# Patient Record
Sex: Female | Born: 1990 | Race: Black or African American | Hispanic: No | Marital: Single | Smoking: Current some day smoker
Health system: Southern US, Community
[De-identification: ages and names within clinical notes are randomized; demographics above are authoritative.]

## PROBLEM LIST (undated history)

## (undated) DIAGNOSIS — G43909 Migraine, unspecified, not intractable, without status migrainosus: Secondary | ICD-10-CM

## (undated) DIAGNOSIS — L309 Dermatitis, unspecified: Secondary | ICD-10-CM

## (undated) DIAGNOSIS — J45909 Unspecified asthma, uncomplicated: Secondary | ICD-10-CM

---

## 2011-01-24 ENCOUNTER — Inpatient Hospital Stay (INDEPENDENT_AMBULATORY_CARE_PROVIDER_SITE_OTHER)
Admission: RE | Admit: 2011-01-24 | Discharge: 2011-01-24 | Disposition: A | Discharge status: Home / Self Care | Payer: Medicaid Other | Source: Ambulatory Visit | Attending: Family Medicine | Admitting: Family Medicine

## 2011-01-24 DIAGNOSIS — M549 Dorsalgia, unspecified: Secondary | ICD-10-CM

## 2011-01-24 DIAGNOSIS — R197 Diarrhea, unspecified: Secondary | ICD-10-CM

## 2011-01-24 LAB — POCT I-STAT, CHEM 8
BUN: 6 mg/dL (ref 6–23)
Calcium, Ion: 1.26 mmol/L (ref 1.12–1.32)
Glucose, Bld: 67 mg/dL — ABNORMAL LOW (ref 70–99)
TCO2: 26 mmol/L (ref 0–100)

## 2011-01-24 LAB — POCT URINALYSIS DIP (DEVICE)
Leukocytes, UA: NEGATIVE
Protein, ur: 30 mg/dL — AB
Specific Gravity, Urine: 1.01 (ref 1.005–1.030)
Urobilinogen, UA: 0.2 mg/dL (ref 0.0–1.0)

## 2011-01-24 LAB — POCT PREGNANCY, URINE: Preg Test, Ur: NEGATIVE

## 2011-12-01 ENCOUNTER — Encounter (HOSPITAL_COMMUNITY): Payer: Self-pay | Admitting: *Deleted

## 2011-12-01 ENCOUNTER — Emergency Department (INDEPENDENT_AMBULATORY_CARE_PROVIDER_SITE_OTHER)
Admission: EM | Admit: 2011-12-01 | Discharge: 2011-12-01 | Disposition: A | Discharge status: Home / Self Care | Payer: Medicaid Other | Source: Home / Self Care

## 2011-12-01 DIAGNOSIS — M549 Dorsalgia, unspecified: Secondary | ICD-10-CM

## 2011-12-01 DIAGNOSIS — R300 Dysuria: Secondary | ICD-10-CM

## 2011-12-01 DIAGNOSIS — Z2089 Contact with and (suspected) exposure to other communicable diseases: Secondary | ICD-10-CM

## 2011-12-01 DIAGNOSIS — Z202 Contact with and (suspected) exposure to infections with a predominantly sexual mode of transmission: Secondary | ICD-10-CM

## 2011-12-01 DIAGNOSIS — N898 Other specified noninflammatory disorders of vagina: Secondary | ICD-10-CM

## 2011-12-01 LAB — POCT URINALYSIS DIP (DEVICE)
Glucose, UA: NEGATIVE mg/dL
Hgb urine dipstick: NEGATIVE
Nitrite: NEGATIVE
Protein, ur: NEGATIVE mg/dL
Urobilinogen, UA: 0.2 mg/dL (ref 0.0–1.0)

## 2011-12-01 LAB — WET PREP, GENITAL: Yeast Wet Prep HPF POC: NONE SEEN

## 2011-12-01 MED ORDER — CEFTRIAXONE SODIUM 250 MG IJ SOLR
INTRAMUSCULAR | Status: AC
  Filled 2011-12-01: qty 250

## 2011-12-01 MED ORDER — AZITHROMYCIN 250 MG PO TABS
ORAL_TABLET | ORAL | Status: AC
  Filled 2011-12-01: qty 4

## 2011-12-01 MED ORDER — IBUPROFEN 600 MG PO TABS: 600.0000 mg | ORAL_TABLET | Freq: Four times a day (QID) | ORAL | Status: AC | PRN

## 2011-12-01 MED ORDER — LIDOCAINE HCL (PF) 1 % IJ SOLN
INTRAMUSCULAR | Status: AC
  Filled 2011-12-01: qty 5

## 2011-12-01 MED ORDER — AZITHROMYCIN 250 MG PO TABS
1000.0000 mg | ORAL_TABLET | Freq: Every day | ORAL | Status: DC
  Administered 2011-12-01 (×2): 1000 mg via ORAL

## 2011-12-01 MED ORDER — METHYLPREDNISOLONE 4 MG PO KIT: PACK | ORAL | Status: AC

## 2011-12-01 MED ORDER — CEFTRIAXONE SODIUM 250 MG IJ SOLR
250.0000 mg | Freq: Once | INTRAMUSCULAR | Status: AC
  Administered 2011-12-01: 250 mg via INTRAMUSCULAR

## 2011-12-01 NOTE — ED Provider Notes (Signed)
History     CSN: [id number]  Arrival date & time 12/01/11  1758   None     Chief Complaint  Patient presents with  . Urinary Tract Infection    (Consider location/radiation/quality/duration/timing/severity/associated sxs/prior treatment) The history is provided by the patient.  21 y.o. female complains of dysuria and malodorous white vaginal discharge for 2 weeks.  Denies abnormal vaginal bleeding, significant pelvic pain or fever. Sexually active, bisexual, uses toys witn hx of new partner, states partner was tested positive for chlamydia.  Last unprotected intercourse with toy 3 weeks ago.  Denies history of known exposure to STD.  Patient's last menstrual period was 09/26/2011.  No history of STD's.  Additionally complains of chronic back pain and stiffness, pain is worse with bending and movement.  States she has taken ibuprofen for pain with minimal relief. Denies radiation, no numbness or tingling in extremities.  No known injury.  Reports hx of "cracking" her back.  History reviewed. No pertinent past medical history.  History reviewed. No pertinent past surgical history.  History reviewed. No pertinent family history.  History  Substance Use Topics  . Smoking status: Current Everyday Smoker  . Smokeless tobacco: Not on file  . Alcohol Use: Yes     occasionally    OB History    Grav Para Term Preterm Abortions TAB SAB Ect Mult Living                  Review of Systems  Constitutional: Negative.   Respiratory: Negative.   Cardiovascular: Negative.   Gastrointestinal: Negative for nausea, vomiting, abdominal pain and diarrhea.  Genitourinary: Positive for dysuria and vaginal discharge. Negative for urgency, hematuria, flank pain, decreased urine volume, vaginal bleeding, difficulty urinating and pelvic pain.  Musculoskeletal: Positive for back pain. Negative for myalgias, joint swelling, arthralgias and gait problem.  Skin: Negative.   Neurological: Negative.      Allergies  Review of patient's allergies indicates no known allergies.  Home Medications   Current Outpatient Rx  Name Route Sig Dispense Refill  . IBUPROFEN 600 MG PO TABS Oral Take 1 tablet (600 mg total) by mouth every 6 (six) hours as needed for pain. 30 tablet 0  . METHYLPREDNISOLONE 4 MG PO KIT  follow package directions 21 tablet 0    BP 112/74  Pulse 70  Temp 97 F (36.1 C) (Oral)  Resp 16  SpO2 100%  LMP 09/26/2011  Physical Exam  Nursing note and vitals reviewed. Constitutional: She is oriented to person, place, and time. Vital signs are normal. She appears well-developed and well-nourished. She is active and cooperative.  HENT:  Head: Normocephalic.  Eyes: Conjunctivae are normal. Pupils are equal, round, and reactive to light. No scleral icterus.  Neck: Trachea normal. Neck supple.  Cardiovascular: Normal rate and regular rhythm.   Pulmonary/Chest: Effort normal and breath sounds normal.  Abdominal: Soft. Bowel sounds are normal. There is no tenderness.  Genitourinary: Uterus normal. No labial fusion. There is no rash, tenderness, lesion or injury on the right labia. There is no rash, tenderness, lesion or injury on the left labia. Cervix exhibits no motion tenderness, no discharge and no friability. Right adnexum displays no mass, no tenderness and no fullness. Left adnexum displays no mass, no tenderness and no fullness. No erythema, tenderness or bleeding around the vagina. No foreign body around the vagina. No signs of injury around the vagina. Vaginal discharge found.  Musculoskeletal:       Lumbar back: She exhibits  tenderness and pain. She exhibits normal range of motion, no bony tenderness, no swelling, no edema and no deformity.       Bilateral lumbar paraspinal tenderness, negative straight leg raises  Lymphadenopathy:       Right: No inguinal adenopathy present.       Left: Inguinal adenopathy present.  Neurological: She is alert and oriented to  person, place, and time. She has normal strength and normal reflexes. No cranial nerve deficit or sensory deficit. Coordination and gait normal. GCS eye subscore is 4. GCS verbal subscore is 5. GCS motor subscore is 6.  Skin: Skin is warm and dry. No rash noted.  Psychiatric: She has a normal mood and affect. Her speech is normal and behavior is normal. Judgment and thought content normal. Cognition and memory are normal.    ED Course  Procedures (including critical care time)  Labs Reviewed  WET PREP, GENITAL - Abnormal; Notable for the following:    WBC, Wet Prep HPF POC FEW (*)     All other components within normal limits  POCT URINALYSIS DIP (DEVICE)  POCT PREGNANCY, URINE  GC/CHLAMYDIA PROBE AMP, GENITAL   No results found.   1. Exposure to STD   2. Dysuria   3. Vaginal Discharge   4. Back pain       MDM  Rocephin 250mg  IM and Azithromycin 1000mg  PO administered in the office.  Cleanse sex toys between use.  Your partner(s) will need to be evaluated and tested for STD's.  Condoms for STD prevention.  Abstain from intercourse for 7 days.  Take medrol dose pak as prescribed followed by ibuprofen for intermittent back pain.  Back exercises, you may benefit from physical therapy if this pain persist.        Johnsie Kindred, NP 12/01/11 2051

## 2011-12-01 NOTE — ED Notes (Signed)
1 week of urinary burning without fever.  She also reports low back pain the past 2 years and was seen by her PCP last year and  was told to get a workup for possible Lupus.  She has not done this yet.

## 2011-12-02 LAB — GC/CHLAMYDIA PROBE AMP, GENITAL
Chlamydia, DNA Probe: NEGATIVE
GC Probe Amp, Genital: NEGATIVE

## 2011-12-02 NOTE — ED Provider Notes (Signed)
Medical screening examination/treatment/procedure(s) were performed by non-physician practitioner and as supervising physician I was immediately available for consultation/collaboration.  Dashel Goines   Deangela Randleman, MD 12/02/11 1106 

## 2012-03-01 ENCOUNTER — Emergency Department (INDEPENDENT_AMBULATORY_CARE_PROVIDER_SITE_OTHER)
Admission: EM | Admit: 2012-03-01 | Discharge: 2012-03-01 | Disposition: A | Discharge status: Home / Self Care | Payer: Medicaid Other | Source: Home / Self Care | Attending: Family Medicine | Admitting: Family Medicine

## 2012-03-01 ENCOUNTER — Encounter (HOSPITAL_COMMUNITY): Payer: Self-pay | Admitting: Emergency Medicine

## 2012-03-01 DIAGNOSIS — B356 Tinea cruris: Secondary | ICD-10-CM

## 2012-03-01 HISTORY — DX: Dermatitis, unspecified: L30.9

## 2012-03-01 MED ORDER — CLOTRIMAZOLE-BETAMETHASONE 1-0.05 % EX CREA: TOPICAL_CREAM | CUTANEOUS | Status: DC

## 2012-03-01 NOTE — ED Provider Notes (Signed)
Medical screening examination/treatment/procedure(s) were performed by resident physician or non-physician practitioner and as supervising physician I was immediately available for consultation/collaboration.   Mariapaula Krist DOUGLAS MD.    Shaunae Sieloff D Adra Shepler, MD 03/01/12 2135 

## 2012-03-01 NOTE — ED Provider Notes (Signed)
History     CSN: [id number]  Arrival date & time 03/01/12  1152   None     Chief Complaint  Patient presents with  . Tinea    (Consider location/radiation/quality/duration/timing/severity/associated sxs/prior treatment) Patient is a 21 y.o. female presenting with rash. The history is provided by the patient.  Rash  This is a new problem. The current episode started more than 1 week ago. The problem has been gradually worsening (spreading). Associated with: family member with same infection. There has been no fever. The rash is present on the neck, right arm and abdomen. The patient is experiencing no pain. Associated symptoms include itching. Pertinent negatives include no blisters, no pain and no weeping. She has tried anti-itch cream for the symptoms. The treatment provided mild relief.    Past Medical History  Diagnosis Date  . Eczema     History reviewed. No pertinent past surgical history.  No family history on file.  History  Substance Use Topics  . Smoking status: Current Every Day Smoker  . Smokeless tobacco: Not on file  . Alcohol Use: Yes     Comment: occasionally    OB History    Grav Para Term Preterm Abortions TAB SAB Ect Mult Living                  Review of Systems  Skin: Positive for itching and rash.  All other systems reviewed and are negative.    Allergies  Review of patient's allergies indicates no known allergies.  Home Medications   Current Outpatient Rx  Name  Route  Sig  Dispense  Refill  . CLOTRIMAZOLE-BETAMETHASONE 1-0.05 % EX CREA      Apply to affected area 2 times daily   15 g   1     BP 105/72  Pulse 80  Temp 97.9 F (36.6 C) (Oral)  Resp 20  SpO2 100%  LMP 01/18/2012  Physical Exam  Nursing note and vitals reviewed. Constitutional: She is oriented to person, place, and time. Vital signs are normal. She appears well-developed and well-nourished. She is active and cooperative.  HENT:  Head: Normocephalic.    Eyes: Conjunctivae normal are normal. Pupils are equal, round, and reactive to light. No scleral icterus.  Neck: Trachea normal. Neck supple.  Cardiovascular: Normal rate, regular rhythm, normal heart sounds and intact distal pulses.   Pulmonary/Chest: Effort normal and breath sounds normal.  Neurological: She is alert and oriented to person, place, and time. No cranial nerve deficit or sensory deficit.  Skin: Skin is warm and dry. Rash noted.          Multiple annular, hyperpigmented lesions  Psychiatric: She has a normal mood and affect. Her speech is normal and behavior is normal. Judgment and thought content normal. Cognition and memory are normal.    ED Course  Procedures (including critical care time)  Labs Reviewed - No data to display No results found.   1. Tinea cruris       MDM  Cool showers; avoid heat, sunlight and anything that makes condition worse.  Begin medication as prescribed.  RTC if symptoms do not improve or begin to have problems swallowing, breathing or significant change in condition.       Johnsie Kindred, NP 03/01/12 1256

## 2012-03-01 NOTE — ED Notes (Signed)
Pt c/o poss ringworms... Came in contact w/20 year old cousin who has ringworm... Rash located on: right side of neck, shoulder, hip, abd... Denies: fevers, vomiting, nauseas, diarrhea... Pt is alert w/no signs of distress.

## 2012-03-19 ENCOUNTER — Encounter (HOSPITAL_COMMUNITY): Payer: Self-pay | Admitting: Emergency Medicine

## 2012-03-19 ENCOUNTER — Emergency Department (INDEPENDENT_AMBULATORY_CARE_PROVIDER_SITE_OTHER)
Admission: EM | Admit: 2012-03-19 | Discharge: 2012-03-19 | Disposition: A | Discharge status: Home / Self Care | Payer: Medicaid Other | Source: Home / Self Care | Attending: Emergency Medicine | Admitting: Emergency Medicine

## 2012-03-19 DIAGNOSIS — H60399 Other infective otitis externa, unspecified ear: Secondary | ICD-10-CM

## 2012-03-19 DIAGNOSIS — H6090 Unspecified otitis externa, unspecified ear: Secondary | ICD-10-CM

## 2012-03-19 MED ORDER — NEOMYCIN-POLYMYXIN-HC 3.5-10000-1 OT SUSP: 3.0000 [drp] | Freq: Four times a day (QID) | OTIC | Status: DC

## 2012-03-19 NOTE — ED Notes (Signed)
Pt c/o bilateral ear pain x1 week... Sx include: vomiting, nauseas... Denies: drainage, fevers, diarrhea, cold sx... She is alert w/no sign of acute distress.

## 2012-03-19 NOTE — Discharge Instructions (Signed)
Otitis Externa Otitis externa is a bacterial or fungal infection of the outer ear canal. This is the area from the eardrum to the outside of the ear. Otitis externa is sometimes called "swimmer's ear." CAUSES  Possible causes of infection include:  Swimming in dirty water.  Moisture remaining in the ear after swimming or bathing.  Mild injury (trauma) to the ear.  Objects stuck in the ear (foreign body).  Cuts or scrapes (abrasions) on the outside of the ear. SYMPTOMS  The first symptom of infection is often itching in the ear canal. Later signs and symptoms may include swelling and redness of the ear canal, ear pain, and yellowish-white fluid (pus) coming from the ear. The ear pain may be worse when pulling on the earlobe. DIAGNOSIS  Your caregiver will perform a physical exam. A sample of fluid may be taken from the ear and examined for bacteria or fungi. TREATMENT  Antibiotic ear drops are often given for 10 to 14 days. Treatment may also include pain medicine or corticosteroids to reduce itching and swelling. PREVENTION   Keep your ear dry. Use the corner of a towel to absorb water out of the ear canal after swimming or bathing.  Avoid scratching or putting objects inside your ear. This can damage the ear canal or remove the protective wax that lines the canal. This makes it easier for bacteria and fungi to grow.  Avoid swimming in lakes, polluted water, or poorly chlorinated pools.  You may use ear drops made of rubbing alcohol and vinegar after swimming. Combine equal parts of white vinegar and alcohol in a bottle. Put 3 or 4 drops into each ear after swimming. HOME CARE INSTRUCTIONS   Apply antibiotic ear drops to the ear canal as prescribed by your caregiver.  Only take over-the-counter or prescription medicines for pain, discomfort, or fever as directed by your caregiver.  If you have diabetes, follow any additional treatment instructions from your caregiver.  Keep all  follow-up appointments as directed by your caregiver. SEEK MEDICAL CARE IF:   You have a fever.  Your ear is still red, swollen, painful, or draining pus after 3 days.  Your redness, swelling, or pain gets worse.  You have a severe headache.  You have redness, swelling, pain, or tenderness in the area behind your ear. MAKE SURE YOU:   Understand these instructions.  Will watch your condition.  Will get help right away if you are not doing well or get worse. Document Released: 03/30/2005 Document Revised: 06/22/2011 Document Reviewed: 04/16/2011 [employer] Patient Information 2013 [employer].  

## 2012-03-19 NOTE — ED Provider Notes (Signed)
Chief Complaint  Patient presents with  . Otalgia    History of Present Illness:   The patient is a 21 year old female who has had a one-week history of bilateral ear pain and congestion. She denies any drainage. Her hearing is normal. She's had no fever, chills, headache, nasal congestion, rhinorrhea, sore throat, swollen glands, or cough. No prior history of ear infections or ear problems. No history of swimming or getting water in the ears.  Review of Systems:  Other than noted above, the patient denies any of the following symptoms: Systemic:  No fevers, chills, sweats, weight loss or gain, fatigue, or tiredness. Eye:  No redness, pain, discharge, itching, blurred vision, or diplopia. ENT:  No headache, nasal congestion, sneezing, itching, epistaxis, ear pain, congestion, decreased hearing, ringing in ears, vertigo, or tinnitus.  No oral lesions, sore throat, pain on swallowing, or hoarseness. Neck:  No mass, tenderness or adenopathy. Lungs:  No coughing, wheezing, or shortness of breath. Skin:  No rash or itching.  PMFSH:  Past medical history, family history, social history, meds, and allergies were reviewed.  Physical Exam:   Vital signs:  BP 101/66  Pulse 71  Temp 97.9 F (36.6 C) (Oral)  Resp 17  SpO2 100%  LMP 01/18/2012 General:  Alert and oriented.  In no distress.  Skin warm and dry. Eye:  PERRL, full EOMs, lids and conjunctiva normal.   ENT:  Her canals are very small. There is some cerumen in the ear canals and this was cleaned out. The ear canals were slightly erythematous but no swelling or exudate or debris. The TMs were well seen and were normal.  Nasal mucosa not congested and without drainage.  Mucous membranes moist, no oral lesions, normal dentition, pharynx clear.  No cranial or facial pain to palplation. Neck:  Supple, full ROM.  No adenopathy, tenderness or mass.  Thyroid normal. Lungs:  Breath sounds clear and equal bilaterally.  No wheezes, rales or  rhonchi. Heart:  Rhythm regular, without extrasystoles.  No gallops or murmers. Skin:  Clear, warm and dry.   Assessment:  The encounter diagnosis was Otitis externa.  Plan:   1.  The following meds were prescribed:   New Prescriptions   NEOMYCIN-POLYMYXIN-HYDROCORTISONE (CORTISPORIN) 3.5-10000-1 OTIC SUSPENSION    Place 3 drops into both ears 4 (four) times daily.   2.  The patient was instructed in symptomatic care and handouts were given. 3.  The patient was told to return if becoming worse in any way, if no better in 3 or 4 days, and given some red flag symptoms that would indicate earlier return.  Follow up:  The patient was told to follow up here if no better in one week.       Reuben Likes, MD 03/19/12 2052

## 2012-07-26 ENCOUNTER — Emergency Department (INDEPENDENT_AMBULATORY_CARE_PROVIDER_SITE_OTHER)
Admission: EM | Admit: 2012-07-26 | Discharge: 2012-07-26 | Disposition: A | Discharge status: Home / Self Care | Payer: Medicaid Other | Source: Home / Self Care | Attending: Emergency Medicine | Admitting: Emergency Medicine

## 2012-07-26 ENCOUNTER — Encounter (HOSPITAL_COMMUNITY): Payer: Self-pay

## 2012-07-26 DIAGNOSIS — J309 Allergic rhinitis, unspecified: Secondary | ICD-10-CM

## 2012-07-26 DIAGNOSIS — L259 Unspecified contact dermatitis, unspecified cause: Secondary | ICD-10-CM

## 2012-07-26 MED ORDER — OLOPATADINE HCL 0.2 % OP SOLN: OPHTHALMIC | Status: DC

## 2012-07-26 MED ORDER — FLUTICASONE PROPIONATE 50 MCG/ACT NA SUSP: 2.0000 | Freq: Every day | NASAL | Status: DC

## 2012-07-26 MED ORDER — TRIAMCINOLONE ACETONIDE 0.1 % EX CREA: TOPICAL_CREAM | Freq: Three times a day (TID) | CUTANEOUS | Status: DC

## 2012-07-26 NOTE — ED Provider Notes (Signed)
Chief Complaint:   Chief Complaint  Patient presents with  . Allergic Reaction    History of Present Illness:   Sharon Adkins is a 22 year old female who had a tattoo on her right forearm about 2 weeks ago. This is been raised, red, and itching. She has had tattoos performed or reactive them in the past. She denies any wheezing or difficulty breathing and no swelling of lips, tongue, or throat. She also has allergy symptoms with nasal congestion with clear rhinorrhea, cough, sore throat, and itchy, watery eyes. She is taking Zyrtec right now for these symptoms.  Review of Systems:  Other than noted above, the patient denies any of the following symptoms: Systemic:  No fever, chills, sweats, weight loss, or fatigue. ENT:  No nasal congestion, rhinorrhea, sore throat, swelling of lips, tongue or throat. Resp:  No cough, wheezing, or shortness of breath. Skin:  No rash, itching, nodules, or suspicious lesions.  PMFSH:  Past medical history, family history, social history, meds, and allergies were reviewed.   Physical Exam:   Vital signs:  BP 106/74  Pulse 70  Temp(Src) 97.6 F (36.4 C) (Oral)  Resp 16  SpO2 100% Gen:  Alert, oriented, in no distress. ENT:  Pharynx clear, no intraoral lesions, moist mucous membranes. Lungs:  Clear to auscultation. Skin:  She has a tattoo on her right arm which is somewhat raised, nontender to palpation, and there is no surrounding erythema or swelling. Skin is otherwise clear.  Assessment:  The primary encounter diagnosis was Contact dermatitis. A diagnosis of Allergic rhinitis was also pertinent to this visit.  She has a reaction to the tattoo.  Plan:   1.  The following meds were prescribed:   Discharge Medication List as of 07/26/2012 12:25 PM    START taking these medications   Details  fluticasone (FLONASE) 50 MCG/ACT nasal spray Place 2 sprays into the nose daily., Starting 07/26/2012, Until Discontinued, Normal    Olopatadine HCl (PATADAY)  0.2 % SOLN 1 drop in both eyes once daily for allergies, Normal    triamcinolone cream (KENALOG) 0.1 % Apply topically 3 (three) times daily., Starting 07/26/2012, Until Discontinued, Normal       2.  The patient was instructed in symptomatic care and handouts were given. 3.  The patient was told to return if becoming worse in any way, if no better in 3 or 4 days, and given some red flag symptoms such as difficulty breathing or worsening pain that would indicate earlier return.     Reuben Likes, MD 07/26/12 [phone]

## 2012-07-26 NOTE — ED Notes (Addendum)
37 week old tattoo on right forearm, using A&D ointment as directed, but has "itchy bumps" on it. No other area has the bumps  Also concern for her seasonal allergies

## 2012-09-24 ENCOUNTER — Encounter (HOSPITAL_COMMUNITY): Payer: Self-pay | Admitting: Emergency Medicine

## 2012-09-24 ENCOUNTER — Emergency Department (HOSPITAL_COMMUNITY)
Admission: EM | Admit: 2012-09-24 | Discharge: 2012-09-24 | Disposition: A | Discharge status: Home / Self Care | Payer: Medicaid Other | Attending: Emergency Medicine | Admitting: Emergency Medicine

## 2012-09-24 ENCOUNTER — Emergency Department (HOSPITAL_COMMUNITY): Payer: Medicaid Other

## 2012-09-24 DIAGNOSIS — F17200 Nicotine dependence, unspecified, uncomplicated: Secondary | ICD-10-CM | POA: Insufficient documentation

## 2012-09-24 DIAGNOSIS — R209 Unspecified disturbances of skin sensation: Secondary | ICD-10-CM | POA: Insufficient documentation

## 2012-09-24 DIAGNOSIS — IMO0002 Reserved for concepts with insufficient information to code with codable children: Secondary | ICD-10-CM | POA: Insufficient documentation

## 2012-09-24 DIAGNOSIS — Z872 Personal history of diseases of the skin and subcutaneous tissue: Secondary | ICD-10-CM | POA: Insufficient documentation

## 2012-09-24 DIAGNOSIS — R51 Headache: Secondary | ICD-10-CM | POA: Insufficient documentation

## 2012-09-24 HISTORY — DX: Migraine, unspecified, not intractable, without status migrainosus: G43.909

## 2012-09-24 NOTE — ED Notes (Signed)
Pt discharged to home with family. NAD.  

## 2012-09-24 NOTE — ED Provider Notes (Signed)
History     CSN: [id number]  Arrival date & time 09/24/12  4098   First MD Initiated Contact with Patient 09/24/12 [phone]      Chief Complaint  Patient presents with  . Headache    (Consider location/radiation/quality/duration/timing/severity/associated sxs/prior treatment) HPI Comments: Patient complains of a two-week history of intermittent left-sided pain in her head that lasts for 2-3 minutes at a time and resolves. As associated with twitching in her left eye and sometimes some numbness in her left arm. The pain resolved on its own. Lungs the last 3 minutes. After 4-5 times a day for the past 2 weeks. She reports history is of "migraines" in the past but has not seen a neurologist. She's taken some Tylenol without effect. She denies any fevers, chills, nausea, vomiting, phonophobia or photophobia. Good by mouth intake and urine output. No rashes or tick bites. No dizziness or lightheadedness. She is not having any symptoms at this time.  The history is provided by the patient.    Past Medical History  Diagnosis Date  . Eczema   . Migraine     History reviewed. No pertinent past surgical history.  No family history on file.  History  Substance Use Topics  . Smoking status: Current Every Day Smoker    Types: Cigars  . Smokeless tobacco: Not on file  . Alcohol Use: Yes     Comment: occasionally    OB History   Grav Para Term Preterm Abortions TAB SAB Ect Mult Living                  Review of Systems  Constitutional: Negative for fever, activity change and appetite change.  HENT: Negative for congestion and rhinorrhea.   Eyes: Negative for photophobia and visual disturbance.  Respiratory: Negative for cough, chest tightness and shortness of breath.   Cardiovascular: Negative for chest pain.  Gastrointestinal: Negative for nausea, vomiting and abdominal pain.  Genitourinary: Negative for dysuria, vaginal bleeding and vaginal discharge.  Musculoskeletal: Negative  for back pain.  Skin: Negative for rash.  Neurological: Positive for numbness and headaches. Negative for weakness and light-headedness.  A complete 10 system review of systems was obtained and all systems are negative except as noted in the HPI and PMH.    Allergies  Review of patient's allergies indicates no known allergies.  Home Medications   Current Outpatient Rx  Name  Route  Sig  Dispense  Refill  . fluticasone (FLONASE) 50 MCG/ACT nasal spray   Nasal   Place 2 sprays into the nose daily.   16 g   12     BP 102/72  Pulse 72  Temp(Src) 97.6 F (36.4 C) (Oral)  Resp 18  Ht 5\' 5"  (1.651 m)  Wt 114 lb (51.71 kg)  BMI 18.97 kg/m2  SpO2 100%  LMP 08/27/2012  Physical Exam  Constitutional: She is oriented to person, place, and time. She appears well-developed and well-nourished. No distress.  HENT:  Head: Normocephalic and atraumatic.  Mouth/Throat: Oropharynx is clear and moist. No oropharyngeal exudate.  No temporal artery tenderness L posterior scalp TTP  Eyes: Conjunctivae and EOM are normal. Pupils are equal, round, and reactive to light.  Neck: Normal range of motion. Neck supple.  Cardiovascular: Normal rate, regular rhythm and normal heart sounds.   No murmur heard. Pulmonary/Chest: Effort normal and breath sounds normal. No respiratory distress. She has no wheezes.  Abdominal: Soft. There is no tenderness. There is no rebound and no guarding.  Musculoskeletal: Normal range of motion. She exhibits no edema and no tenderness.  Neurological: She is alert and oriented to person, place, and time. No cranial nerve deficit. She exhibits normal muscle tone. Coordination normal.  CN 2-12 intact, no ataxia on finger to nose, no nystagmus, 5/5 strength throughout, no pronator drift, Romberg negative, normal gait.   Skin: Skin is warm.    ED Course  Procedures (including critical care time)  Labs Reviewed - No data to display Ct Head Wo Contrast  09/24/2012    *RADIOLOGY REPORT*  Clinical Data: Intermittent pain on the left side of the head.  CT HEAD WITHOUT CONTRAST  Technique:  Contiguous axial images were obtained from the base of the skull through the vertex without contrast.  Comparison: None.  Findings: No evidence for acute hemorrhage, mass lesion, midline shift, hydrocephalus or large infarct.  Visualized sinuses are clear.  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  No acute bony abnormality.  IMPRESSION: Negative head CT.   Original Report Authenticated By: Richarda Overlie, M.D.     No diagnosis found.    MDM  Intermittent left-sided headaches for the past 2 weeks lasting a few minutes at a time and resolving. Associated with eye twitching and numbness. No symptoms currently.  Neurological exam normal. History exam not consistent with subarachnoid hemorrhage or meningitis. Favor probable migraine. Patient has not had CT imaging we'll obtain to rule out mass lesion.  CT head normal. Neuro exam normal.  Will refer to neurology for followup.     Glynn Octave, MD 09/24/12 [phone]

## 2012-09-24 NOTE — ED Notes (Signed)
Visual acuity screening completed. Left eye 20/25, right eye 20/20, both eyes 20/20.

## 2012-09-24 NOTE — ED Notes (Addendum)
Patient states she has been having intermittent sharp pains on the left side of her head.  Patient states that when she has the pain "I also have a twitch in my eye".   Patient states that sometimes her L arm gets numb.   Patient states she had this same trouble a year ago and was told she had migraines.  Patient states its a brief pain that last about 2 minutes or so each time.

## 2013-02-16 ENCOUNTER — Encounter (HOSPITAL_COMMUNITY): Payer: Self-pay | Admitting: Emergency Medicine

## 2013-02-16 ENCOUNTER — Emergency Department (HOSPITAL_COMMUNITY)
Admission: EM | Admit: 2013-02-16 | Discharge: 2013-02-16 | Disposition: A | Discharge status: Home / Self Care | Payer: Medicaid Other | Attending: Emergency Medicine | Admitting: Emergency Medicine

## 2013-02-16 ENCOUNTER — Emergency Department (HOSPITAL_COMMUNITY): Payer: Medicaid Other

## 2013-02-16 DIAGNOSIS — R51 Headache: Secondary | ICD-10-CM | POA: Insufficient documentation

## 2013-02-16 DIAGNOSIS — R0789 Other chest pain: Secondary | ICD-10-CM | POA: Insufficient documentation

## 2013-02-16 DIAGNOSIS — J069 Acute upper respiratory infection, unspecified: Secondary | ICD-10-CM | POA: Insufficient documentation

## 2013-02-16 DIAGNOSIS — F17200 Nicotine dependence, unspecified, uncomplicated: Secondary | ICD-10-CM | POA: Insufficient documentation

## 2013-02-16 DIAGNOSIS — Z872 Personal history of diseases of the skin and subcutaneous tissue: Secondary | ICD-10-CM | POA: Insufficient documentation

## 2013-02-16 DIAGNOSIS — Z8679 Personal history of other diseases of the circulatory system: Secondary | ICD-10-CM | POA: Insufficient documentation

## 2013-02-16 DIAGNOSIS — J40 Bronchitis, not specified as acute or chronic: Secondary | ICD-10-CM | POA: Insufficient documentation

## 2013-02-16 MED ORDER — FLUTICASONE PROPIONATE 50 MCG/ACT NA SUSP: 2.0000 | Freq: Every day | NASAL | Status: DC

## 2013-02-16 NOTE — ED Notes (Signed)
Pt to xray

## 2013-02-16 NOTE — ED Notes (Signed)
Pt c/o URI x 3 weeks with cough

## 2013-02-16 NOTE — ED Provider Notes (Signed)
CSN: [id number]     Arrival date & time 02/16/13  1836 History  This chart was scribed for non-physician practitioner, Dierdre Forth, PA-C working with No att. providers found by Clydene Laming, ED Scribe. This patient was seen in room TR04C/TR04C and the patient's care was started at 7:50 PM.   Chief Complaint  Patient presents with  . URI    The history is provided by the patient and medical records. No language interpreter was used.   HPI Comments: Eduarda Scrivens is a 22 y.o. female who presents to the Emergency Department complaining of a cold onset three weeks ago. Pt is experiencing a productive cough, chest pain, mild ear pain, and nasal congestion. Pt denies sore throat, nausea, emesis, and rash. Pt does not have hx of medical problems or asthma. Pt has been taking Nyquil with mild relief which also helps her sleep at night. Her roommate also presents to the ED today with similar symptoms.  Patient denies fever, chills, neck pain, neck stiffness, abdominal pain.  Past Medical History  Diagnosis Date  . Eczema   . Migraine    History reviewed. No pertinent past surgical history. History reviewed. No pertinent family history. History  Substance Use Topics  . Smoking status: Current Every Day Smoker    Types: Cigars  . Smokeless tobacco: Not on file  . Alcohol Use: Yes     Comment: occasionally   OB History   Grav Para Term Preterm Abortions TAB SAB Ect Mult Living                 Review of Systems  Constitutional: Positive for fatigue. Negative for fever, chills and appetite change.  HENT: Positive for congestion, postnasal drip, rhinorrhea and sinus pressure. Negative for ear discharge, ear pain, mouth sores and sore throat.   Eyes: Negative for visual disturbance.  Respiratory: Positive for cough, chest tightness, shortness of breath and wheezing. Negative for stridor.   Cardiovascular: Negative for chest pain, palpitations and leg swelling.  Gastrointestinal:  Negative for nausea, vomiting, abdominal pain and diarrhea.  Genitourinary: Negative for dysuria, urgency, frequency and hematuria.  Musculoskeletal: Negative for arthralgias, back pain, myalgias and neck stiffness.  Skin: Negative for rash.  Neurological: Positive for headaches. Negative for syncope, light-headedness and numbness.  Hematological: Negative for adenopathy.  Psychiatric/Behavioral: The patient is not nervous/anxious.   All other systems reviewed and are negative.    Allergies  Review of patient's allergies indicates no known allergies.  Home Medications   Current Outpatient Rx  Name  Route  Sig  Dispense  Refill  . fluticasone (FLONASE) 50 MCG/ACT nasal spray   Each Nare   Place 2 sprays into both nostrils daily.   16 g   2    Triage Vitals:BP 104/62  Pulse 67  Temp(Src) 97.5 F (36.4 C) (Oral)  Resp 18  SpO2 100% Physical Exam  Constitutional: She is oriented to person, place, and time. She appears well-developed and well-nourished. No distress.  HENT:  Head: Normocephalic and atraumatic.  Right Ear: Tympanic membrane, external ear and ear canal normal.  Left Ear: Tympanic membrane, external ear and ear canal normal.  Nose: Mucosal edema and rhinorrhea present. No epistaxis. Right sinus exhibits no maxillary sinus tenderness and no frontal sinus tenderness. Left sinus exhibits no maxillary sinus tenderness and no frontal sinus tenderness.  Mouth/Throat: Uvula is midline and mucous membranes are normal. Mucous membranes are not pale and not cyanotic. No oropharyngeal exudate, posterior oropharyngeal edema, posterior oropharyngeal erythema  or tonsillar abscesses.  Eyes: Conjunctivae are normal. Pupils are equal, round, and reactive to light.  Neck: Normal range of motion and full passive range of motion without pain.  No nuchal rigidity  Cardiovascular: Normal rate, regular rhythm, normal heart sounds and intact distal pulses.   No murmur  heard. Pulmonary/Chest: Effort normal and breath sounds normal. No stridor.  Clear and equal breath sounds  Abdominal: Soft. Bowel sounds are normal. She exhibits no distension. There is no tenderness.  Abdomen soft and nontender  Musculoskeletal: Normal range of motion.  Lymphadenopathy:    She has no cervical adenopathy.  Neurological: She is alert and oriented to person, place, and time. She exhibits normal muscle tone. Coordination normal.  Skin: Skin is warm and dry. No rash noted. She is not diaphoretic. No erythema.  Psychiatric: She has a normal mood and affect.    ED Course  Procedures (including critical care time) DIAGNOSTIC STUDIES: Oxygen Saturation is 100% on RA, normal by my interpretation.    COORDINATION OF CARE: 7:56 PM- Discussed treatment plan with pt at bedside. Pt verbalized understanding and agreement with plan.   Labs Review Labs Reviewed - No data to display Imaging Review Dg Chest 2 View  02/16/2013   CLINICAL DATA:  Cold symptoms  EXAM: CHEST  2 VIEW  COMPARISON:  None.  FINDINGS: The heart size and mediastinal contours are within normal limits. Both lungs are clear. The visualized skeletal structures are unremarkable.  IMPRESSION: No active cardiopulmonary disease.   Electronically Signed   By: Alcide Clever M.D.   On: 02/16/2013 19:26    EKG Interpretation   None       MDM   1. Viral URI with cough   2. Bronchitis      Aprille Marda Stalker presents with URI symptoms.  Pt CXR negative for acute infiltrate. I personally reviewed the imaging tests through PACS system.  I reviewed available ER/hospitalization records through the EMR.  Patients symptoms are consistent with URI, likely viral etiology. Discussed that antibiotics are not indicated for viral infections. Pt will be discharged with symptomatic treatment.  Verbalizes understanding and is agreeable with plan. Pt is hemodynamically stable & in NAD prior to dc.  It has been determined that no  acute conditions requiring further emergency intervention are present at this time. The patient/guardian have been advised of the diagnosis and plan. We have discussed signs and symptoms that warrant return to the ED, such as changes or worsening in symptoms.   Vital signs are stable at discharge.   BP 104/62  Pulse 67  Temp(Src) 97.5 F (36.4 C) (Oral)  Resp 18  SpO2 100%  LMP 02/05/2013  Patient/guardian has voiced understanding and agreed to follow-up with the PCP or specialist.    I personally performed the services described in this documentation, which was scribed in my presence. The recorded information has been reviewed and is accurate.    Dahlia Client Mitsue Peery, PA-C 02/16/13 2133

## 2013-02-16 NOTE — ED Notes (Signed)
The pt is c/o having a cold for 3 weeks  With coughing  Chest congestion and not getting better.  White sputum

## 2013-02-17 NOTE — ED Provider Notes (Signed)
Medical screening examination/treatment/procedure(s) were performed by non-physician practitioner and as supervising physician I was immediately available for consultation/collaboration.  EKG Interpretation   None        Shon Baton, MD 02/17/13 1442

## 2013-06-22 ENCOUNTER — Emergency Department (HOSPITAL_COMMUNITY)
Admission: EM | Admit: 2013-06-22 | Discharge: 2013-06-22 | Disposition: A | Discharge status: Home / Self Care | Payer: Medicaid Other | Attending: Emergency Medicine | Admitting: Emergency Medicine

## 2013-06-22 ENCOUNTER — Encounter (HOSPITAL_COMMUNITY): Payer: Self-pay | Admitting: Emergency Medicine

## 2013-06-22 ENCOUNTER — Emergency Department (HOSPITAL_COMMUNITY): Payer: Medicaid Other

## 2013-06-22 DIAGNOSIS — Y9389 Activity, other specified: Secondary | ICD-10-CM | POA: Insufficient documentation

## 2013-06-22 DIAGNOSIS — Y9289 Other specified places as the place of occurrence of the external cause: Secondary | ICD-10-CM | POA: Insufficient documentation

## 2013-06-22 DIAGNOSIS — Z872 Personal history of diseases of the skin and subcutaneous tissue: Secondary | ICD-10-CM | POA: Insufficient documentation

## 2013-06-22 DIAGNOSIS — S62607A Fracture of unspecified phalanx of left little finger, initial encounter for closed fracture: Secondary | ICD-10-CM

## 2013-06-22 DIAGNOSIS — F17200 Nicotine dependence, unspecified, uncomplicated: Secondary | ICD-10-CM | POA: Insufficient documentation

## 2013-06-22 DIAGNOSIS — Z8679 Personal history of other diseases of the circulatory system: Secondary | ICD-10-CM | POA: Insufficient documentation

## 2013-06-22 DIAGNOSIS — W230XXA Caught, crushed, jammed, or pinched between moving objects, initial encounter: Secondary | ICD-10-CM | POA: Insufficient documentation

## 2013-06-22 DIAGNOSIS — S62639B Displaced fracture of distal phalanx of unspecified finger, initial encounter for open fracture: Secondary | ICD-10-CM | POA: Insufficient documentation

## 2013-06-22 MED ORDER — IBUPROFEN 800 MG PO TABS: 800.0000 mg | ORAL_TABLET | Freq: Three times a day (TID) | ORAL | Status: DC

## 2013-06-22 MED ORDER — IBUPROFEN 400 MG PO TABS
800.0000 mg | ORAL_TABLET | Freq: Once | ORAL | Status: AC
  Administered 2013-06-22: 800 mg via ORAL
  Filled 2013-06-22: qty 2

## 2013-06-22 MED ORDER — ACETAMINOPHEN-CODEINE #3 300-30 MG PO TABS: 1.0000 | ORAL_TABLET | Freq: Four times a day (QID) | ORAL | Status: DC | PRN

## 2013-06-22 NOTE — ED Notes (Signed)
Ortho paged. 

## 2013-06-22 NOTE — Discharge Instructions (Signed)
Please follow up with your primary care physician in 1-2 days. If you do not have one please [facility] listed above. Please follow up with Dr. Amanda PeaGramig, the hand surgeon to schedule a follow up appointment. Please take pain medication and/or muscle relaxants as prescribed and as needed for pain. Please do not drive on narcotic pain medication or on muscle relaxants. Please read all discharge instructions and return precautions.      Finger Fracture Fractures of fingers are breaks in the bones of the fingers. There are many types of fractures. There are different ways of treating these fractures. Your health care provider will discuss the best way to treat your fracture. CAUSES Traumatic injury is the main cause of broken fingers. These include:  Injuries while playing sports.  Workplace injuries.  Falls. RISK FACTORS Activities that can increase your risk of finger fractures include:  Sports.  Workplace activities that involve machinery.  A condition called osteoporosis, which can make your bones less dense and cause them to fracture more easily. SIGNS AND SYMPTOMS The main symptoms of a broken finger are pain and swelling within 15 minutes after the injury. Other symptoms include:  Bruising of your finger.  Stiffness of your finger.  Numbness of your finger.  Exposed bones (compound fracture) if the fracture is severe. DIAGNOSIS  The best way to diagnose a broken bone is with X-ray imaging. Additionally, your health care provider will use this X-ray image to evaluate the position of the broken finger bones.  TREATMENT  Finger fractures can be treated with:   Nonreduction This means the bones are in place. The finger is splinted without changing the positions of the bone pieces. The splint is usually left on for about a week to 10 days. This will depend on your fracture and what your health care provider thinks.  Closed reduction The bones  are put back into position without using surgery. The finger is then splinted.  Open reduction and internal fixation The fracture site is opened. Then the bone pieces are fixed into place with pins or some type of hardware. This is seldom required. It depends on the severity of the fracture. HOME CARE INSTRUCTIONS   Follow your health care provider's instructions regarding activities, exercises, and physical therapy.  Only take over-the-counter or prescription medicines for pain, discomfort, or fever as directed by your health care provider. SEEK MEDICAL CARE IF: You have pain or swelling that limits the motion or use of your fingers. SEEK IMMEDIATE MEDICAL CARE IF:  Your finger becomes numb. MAKE SURE YOU:   Understand these instructions.  Will watch your condition.  Will get help right away if you are not doing well or get worse. Document Released: 07/12/2000 Document Revised: 01/18/2013 Document Reviewed: 11/09/2012 [facility] Patient Information 2014 [place].

## 2013-06-22 NOTE — ED Notes (Signed)
Patient transported to X-ray and return 

## 2013-06-22 NOTE — ED Notes (Signed)
Ortho at bedside.

## 2013-06-22 NOTE — ED Notes (Signed)
Pt states that slammed pinky finger or the left hand in the car door last Wed. Finger turned black but is now pink around the nail bed. Able to move the extremity but very painful. Has not taken any meds for pain.

## 2013-06-22 NOTE — ED Notes (Signed)
PA at bedside.

## 2013-06-22 NOTE — ED Notes (Addendum)
Ice applied to left little finger.

## 2013-06-22 NOTE — ED Provider Notes (Signed)
CSN: [id number]     Arrival date & time 06/22/13  1514 History  This chart was scribed for Francee PiccoloJennifer Diksha Tagliaferro, PA working with Raelyn NumberKristen N Ward, DO by Quintella ReichertMatthew Underwood, ED Scribe. This patient was seen in room TR05C/TR05C and the patient's care was started at 3:34 PM.   Chief Complaint  Patient presents with  . Finger Injury    The history is provided by the patient. No language interpreter was used.    HPI Comments: Sharon Adkins is a 23 y.o. female who presents to the Emergency Department complaining of a left pinky finger injury sustained 8 days ago.  Pt states that she accidentally slammed her finger in a car door.  The finger has since become swollen, bruised and painful.  She reports intermittent throbbing non-radiating pain to that finger.  Pain is worsened by squeezing the hand.  She denies alleviating factors.  She used ice for the first several days but has not attempted any other treatments pta.  Pt is left-handed.  She denies previous injury to that hand.   Past Medical History  Diagnosis Date  . Eczema   . Migraine     History reviewed. No pertinent past surgical history.  History reviewed. No pertinent family history.   History  Substance Use Topics  . Smoking status: Current Some Day Smoker    Types: Cigars  . Smokeless tobacco: Not on file  . Alcohol Use: Yes    OB History   Grav Para Term Preterm Abortions TAB SAB Ect Mult Living                   Review of Systems  Musculoskeletal: Positive for arthralgias (left pinky finger) and joint swelling (left pinky finger).  Neurological: Negative for weakness and numbness.      Allergies  Review of patient's allergies indicates no known allergies.  Home Medications   Current Outpatient Rx  Name  Route  Sig  Dispense  Refill  . acetaminophen-codeine (TYLENOL #3) 300-30 MG per tablet   Oral   Take 1-2 tablets by mouth every 6 (six) hours as needed for moderate pain.   15 tablet   0   .  ibuprofen (ADVIL,MOTRIN) 800 MG tablet   Oral   Take 1 tablet (800 mg total) by mouth 3 (three) times daily.   21 tablet   0     BP 128/77  Pulse 98  Temp(Src) 97.9 F (36.6 C) (Oral)  Resp 16  Ht 5\' 5"  (1.651 m)  Wt 111 lb 9.6 oz (50.621 kg)  BMI 18.57 kg/m2  SpO2 94%  Physical Exam  Nursing note and vitals reviewed. Constitutional: She is oriented to person, place, and time. She appears well-developed and well-nourished. No distress.  HENT:  Head: Normocephalic and atraumatic.  Right Ear: External ear normal.  Left Ear: External ear normal.  Nose: Nose normal.  Mouth/Throat: Oropharynx is clear and moist.  Eyes: Conjunctivae are normal.  Neck: Normal range of motion. Neck supple.  Cardiovascular: Normal rate.   Pulmonary/Chest: Effort normal.  Abdominal: Soft.  Musculoskeletal: Normal range of motion.       Left hand: She exhibits tenderness and swelling. She exhibits normal range of motion, no bony tenderness, normal two-point discrimination, normal capillary refill, no deformity and no laceration. Normal sensation noted. Normal strength noted.       Hands: Area of swelling noted on graphical documentation.  No nail ecchymosis. No nailbed injury. No laceration.   Neurological: She is  alert and oriented to person, place, and time.  Skin: Skin is warm and dry. She is not diaphoretic.  Psychiatric: She has a normal mood and affect.    ED Course  Procedures (including critical care time) Medications  ibuprofen (ADVIL,MOTRIN) tablet 800 mg (800 mg Oral Given 06/22/13 1556)     DIAGNOSTIC STUDIES: Oxygen Saturation is 94% on room air, adequate by my interpretation.    COORDINATION OF CARE: 3:37 PM-Discussed treatment plan which includes ibuprofen, ice, and x-ray with pt at bedside and pt agreed to plan.    Labs Review Labs Reviewed - No data to display  Imaging Review Dg Finger Little Left  06/22/2013   CLINICAL DATA:  Slammed finger in door  EXAM: LEFT LITTLE  FINGER 2+V  COMPARISON:  None.  FINDINGS: There is fracture through the base of the distal phalanx of the left fifth digit which involves the left fifth PIP joint space. Slight distraction of the avulsion fragment is noted. No other abnormality is seen.  IMPRESSION: Slightly distracted avulsion fracture from the base of the distal phalanx of the left fifth digit.   Electronically Signed   By: Dwyane Dee M.D.   On: 06/22/2013 16:11     EKG Interpretation None      MDM   Final diagnoses:  Fracture of phalanx of left little finger    Filed Vitals:   06/22/13 1523  BP: 128/77  Pulse: 98  Temp: 97.9 F (36.6 C)  Resp: 16    Afebrile, NAD, non-toxic appearing, AAOx4. Neurovascularly intact. Normal sensation. Bruising noted to left fifth DIP. ROM intact. No nailbed injury. Xray reveals avulsion fracture. Will splint finger, treat with NSAIDs and pain medication with recommended hand surgery follow up. Return precautions discussed. Patient is agreeable to plan. Patient is stable at time of discharge   I personally performed the services described in this documentation, which was scribed in my presence. The recorded information has been reviewed and is accurate.     Jeannetta Ellis, PA-C 06/22/13 1641

## 2013-06-22 NOTE — ED Notes (Signed)
Splint placed via ortho

## 2013-06-22 NOTE — Progress Notes (Signed)
Orthopedic Tech Progress Note Patient Details:  Sharon StarrRossia Emoni Noll 11/14/1990 [id number]  Ortho Devices Type of Ortho Device: Finger splint Ortho Device/Splint Location: LUE Ortho Device/Splint Interventions: Ordered;Application   Jennye MoccasinHughes, Danyetta Gillham Craig 06/22/2013, 4:54 PM

## 2013-06-27 ENCOUNTER — Emergency Department (HOSPITAL_COMMUNITY)
Admission: EM | Admit: 2013-06-27 | Discharge: 2013-06-27 | Disposition: A | Discharge status: Home / Self Care | Payer: Medicaid Other | Attending: Emergency Medicine | Admitting: Emergency Medicine

## 2013-06-27 ENCOUNTER — Encounter (HOSPITAL_COMMUNITY): Payer: Self-pay | Admitting: Emergency Medicine

## 2013-06-27 DIAGNOSIS — Z792 Long term (current) use of antibiotics: Secondary | ICD-10-CM | POA: Insufficient documentation

## 2013-06-27 DIAGNOSIS — J029 Acute pharyngitis, unspecified: Secondary | ICD-10-CM | POA: Insufficient documentation

## 2013-06-27 DIAGNOSIS — Z872 Personal history of diseases of the skin and subcutaneous tissue: Secondary | ICD-10-CM | POA: Insufficient documentation

## 2013-06-27 DIAGNOSIS — H9209 Otalgia, unspecified ear: Secondary | ICD-10-CM | POA: Insufficient documentation

## 2013-06-27 DIAGNOSIS — F17200 Nicotine dependence, unspecified, uncomplicated: Secondary | ICD-10-CM | POA: Insufficient documentation

## 2013-06-27 DIAGNOSIS — Z8619 Personal history of other infectious and parasitic diseases: Secondary | ICD-10-CM | POA: Insufficient documentation

## 2013-06-27 DIAGNOSIS — Z791 Long term (current) use of non-steroidal anti-inflammatories (NSAID): Secondary | ICD-10-CM | POA: Insufficient documentation

## 2013-06-27 DIAGNOSIS — Z8781 Personal history of (healed) traumatic fracture: Secondary | ICD-10-CM | POA: Insufficient documentation

## 2013-06-27 DIAGNOSIS — Z8679 Personal history of other diseases of the circulatory system: Secondary | ICD-10-CM | POA: Insufficient documentation

## 2013-06-27 MED ORDER — HYDROCODONE-HOMATROPINE 5-1.5 MG/5ML PO SYRP: 5.0000 mL | ORAL_SOLUTION | Freq: Four times a day (QID) | ORAL | Status: DC | PRN

## 2013-06-27 MED ORDER — AMOXICILLIN 500 MG PO CAPS: 500.0000 mg | ORAL_CAPSULE | Freq: Three times a day (TID) | ORAL | Status: DC

## 2013-06-27 NOTE — ED Notes (Addendum)
Patient states she has been having generalized body aches with ear and throat pain. States that this has been going on for three days.  Patient also complaining of headaches.

## 2013-06-27 NOTE — Discharge Instructions (Signed)
Strep Throat  Strep throat is an infection of the throat caused by a bacteria named Streptococcus pyogenes. Your caregiver may call the infection streptococcal "tonsillitis" or "pharyngitis" depending on whether there are signs of inflammation in the tonsils or back of the throat. Strep throat is most common in children aged 22 15 years during the cold months of the year, but it can occur in people of any age during any season. This infection is spread from person to person (contagious) through coughing, sneezing, or other close contact.  SYMPTOMS   · Fever or chills.  · Painful, swollen, red tonsils or throat.  · Pain or difficulty when swallowing.  · White or yellow spots on the tonsils or throat.  · Swollen, tender lymph nodes or "glands" of the neck or under the jaw.  · Red rash all over the body (rare).  DIAGNOSIS   Many different infections can cause the same symptoms. A test must be done to confirm the diagnosis so the right treatment can be given. A "rapid strep test" can help your caregiver make the diagnosis in a few minutes. If this test is not available, a light swab of the infected area can be used for a throat culture test. If a throat culture test is done, results are usually available in a day or two.  TREATMENT   Strep throat is treated with antibiotic medicine.  HOME CARE INSTRUCTIONS   · Gargle with 1 tsp of salt in 1 cup of warm water, 3 4 times per day or as needed for comfort.  · Family members who also have a sore throat or fever should be tested for strep throat and treated with antibiotics if they have the strep infection.  · Make sure everyone in your household washes their hands well.  · Do not share food, drinking cups, or personal items that could cause the infection to spread to others.  · You may need to eat a soft food diet until your sore throat gets better.  · Drink enough water and fluids to keep your urine clear or pale yellow. This will help prevent dehydration.  · Get plenty of  rest.  · Stay home from school, daycare, or work until you have been on antibiotics for 24 hours.  · Only take over-the-counter or prescription medicines for pain, discomfort, or fever as directed by your caregiver.  · If antibiotics are prescribed, take them as directed. Finish them even if you start to feel better.  SEEK MEDICAL CARE IF:   · The glands in your neck continue to enlarge.  · You develop a rash, cough, or earache.  · You cough up green, yellow-brown, or bloody sputum.  · You have pain or discomfort not controlled by medicines.  · Your problems seem to be getting worse rather than better.  SEEK IMMEDIATE MEDICAL CARE IF:   · You develop any new symptoms such as vomiting, severe headache, stiff or painful neck, chest pain, shortness of breath, or trouble swallowing.  · You develop severe throat pain, drooling, or changes in your voice.  · You develop swelling of the neck, or the skin on the neck becomes red and tender.  · You have a fever.  · You develop signs of dehydration, such as fatigue, dry mouth, and decreased urination.  · You become increasingly sleepy, or you cannot wake up completely.  Document Released: 03/27/2000 Document Revised: 03/16/2012 Document Reviewed: 05/29/2010  Exit[employer]® Patient Information ©2014 [employer].

## 2013-06-27 NOTE — ED Provider Notes (Signed)
CSN: [id number]     Arrival date & time 06/27/13  0424 History   First MD Initiated Contact with Patient 06/27/13 [id number]     Chief Complaint  Patient presents with  . Otalgia  . Sore Throat  . Generalized Body Aches     (Consider location/radiation/quality/duration/timing/severity/associated sxs/prior Treatment) HPI Sharon Adkins This 23 year old female with a history of eczema, migraine, and recent fracture of the left fifth digit he presents today with chief complaint of sore throat for the past 3 days.  Patient complains of a fever at home up to 102F, history of strep pharyngitis, referred pain to the right ear, pain with swallowing.  She is able to tolerate food and fluids.  She's been using Cepacol throat spray without relief of her symptoms.  Tylenol and Advil with minimal relief.  Patient denies any dental pain, stridor, difficulty breathing. Changes in hearing, tinnitus or ear fullness.  She denies contacts with similar symptoms.  Past Medical History  Diagnosis Date  . Eczema   . Migraine    History reviewed. No pertinent past surgical history. No family history on file. History  Substance Use Topics  . Smoking status: Current Some Day Smoker    Types: Cigars  . Smokeless tobacco: Not on file  . Alcohol Use: Yes   OB History   Grav Para Term Preterm Abortions TAB SAB Ect Mult Living                 Review of Systems Ten systems reviewed and are negative for acute change, except as noted in the HPI.     Allergies  Review of patient's allergies indicates no known allergies.  Home Medications   Current Outpatient Rx  Name  Route  Sig  Dispense  Refill  . acetaminophen-codeine (TYLENOL #3) 300-30 MG per tablet   Oral   Take 1-2 tablets by mouth every 6 (six) hours as needed for moderate pain.   15 tablet   0   . amoxicillin (AMOXIL) 500 MG capsule   Oral   Take 1 capsule (500 mg total) by mouth 3 (three) times daily.   30 capsule   0   .  HYDROcodone-homatropine (HYCODAN) 5-1.5 MG/5ML syrup   Oral   Take 5 mLs by mouth every 6 (six) hours as needed for cough.   30 mL   0   . ibuprofen (ADVIL,MOTRIN) 800 MG tablet   Oral   Take 1 tablet (800 mg total) by mouth 3 (three) times daily.   21 tablet   0    BP 114/74  Pulse 94  Temp(Src) 98.9 F (37.2 C) (Oral)  Resp 16  Ht 5\' 5"  (1.651 m)  Wt 111 lb (50.349 kg)  BMI 18.47 kg/m2  SpO2 97%  LMP 05/31/2013 Physical Exam  Nursing note and vitals reviewed. Constitutional: She is oriented to person, place, and time. She appears well-developed and well-nourished. No distress.  HENT:  Head: Normocephalic and atraumatic.  Tonsillar swelling with exudate and erythema.  Eyes: Conjunctivae are normal.  Neck: Neck supple.  Cardiovascular: Normal rate and regular rhythm.   Pulmonary/Chest: Effort normal and breath sounds normal.  Abdominal: Soft. Bowel sounds are normal. She exhibits no distension. There is no tenderness.  No splenomegaly.  Musculoskeletal: Normal range of motion.  Lymphadenopathy:    She has cervical adenopathy (bilateral tender tonsillar lymphadenopathy).  Neurological: She is alert and oriented to person, place, and time.  Skin: Skin is warm. No rash noted.  Psychiatric:  She has a normal mood and affect. Her behavior is normal.   ED Course  Procedures (including critical care time) Labs Review Labs Reviewed - No data to display Imaging Review No results found.   EKG Interpretation None      MDM   Final diagnoses:  Pharyngitis    Patient meets modified [facility] criteria for treatment of strep pharyngitis. Will dc with amoxil and hycodan. Return precautions discussed.    Arthor CaptainAbigail Hayashida, PA-C 06/27/13 [phone]

## 2013-06-27 NOTE — ED Provider Notes (Signed)
Medical screening examination/treatment/procedure(s) were performed by non-physician practitioner and as supervising physician I was immediately available for consultation/collaboration.   EKG Interpretation None        Jasmin Winberry, MD 06/27/13 0713 

## 2013-06-28 ENCOUNTER — Other Ambulatory Visit (HOSPITAL_COMMUNITY)
Admission: RE | Admit: 2013-06-28 | Discharge: 2013-06-28 | Disposition: A | Discharge status: Home / Self Care | Payer: Medicaid Other | Source: Ambulatory Visit | Attending: Emergency Medicine | Admitting: Emergency Medicine

## 2013-06-28 ENCOUNTER — Encounter (HOSPITAL_COMMUNITY): Payer: Self-pay | Admitting: Emergency Medicine

## 2013-06-28 ENCOUNTER — Emergency Department (HOSPITAL_COMMUNITY)
Admission: EM | Admit: 2013-06-28 | Discharge: 2013-06-28 | Disposition: A | Discharge status: Home / Self Care | Payer: Medicaid Other | Source: Home / Self Care | Attending: Emergency Medicine | Admitting: Emergency Medicine

## 2013-06-28 DIAGNOSIS — Z113 Encounter for screening for infections with a predominantly sexual mode of transmission: Secondary | ICD-10-CM | POA: Insufficient documentation

## 2013-06-28 DIAGNOSIS — J039 Acute tonsillitis, unspecified: Secondary | ICD-10-CM

## 2013-06-28 LAB — POCT INFECTIOUS MONO SCREEN: Mono Screen: NEGATIVE

## 2013-06-28 LAB — POCT RAPID STREP A: Streptococcus, Group A Screen (Direct): NEGATIVE

## 2013-06-28 MED ORDER — PREDNISONE 20 MG PO TABS: 20.0000 mg | ORAL_TABLET | Freq: Two times a day (BID) | ORAL | Status: DC

## 2013-06-28 MED ORDER — CLINDAMYCIN HCL 300 MG PO CAPS: 300.0000 mg | ORAL_CAPSULE | Freq: Four times a day (QID) | ORAL | Status: DC

## 2013-06-28 NOTE — Discharge Instructions (Signed)

## 2013-06-28 NOTE — ED Notes (Signed)
States she was in ED on 3-17, and was treated w Rx for strep , but was not tested . C/o she feels worse, pain 9-on 1-10 scale. NAD. Has filed her Rx and is reportedly taking rx as written

## 2013-06-28 NOTE — ED Provider Notes (Signed)
Chief Complaint   Chief Complaint  Patient presents with  . Sore Throat    History of Present Illness   Sharon Adkins is a 23 year old female who has had a one-week history of sore throat. She's also had chills and headache. She was at the emergency room to 3 days ago. She was examined but no testing was done. She was given amoxicillin and hydrocodone. She states there has been no improvement since that. She denies any fever, nasal congestion, rhinorrhea, cough, or GI symptoms. There is a concern about gonococcal pharyngitis, since she has been sexually active with 2 other females. Year them have any symptoms. She is in today with both of her sexual partners.   Review of Systems   Other than as noted above, the patient denies any of the following symptoms. Systemic:  No fever, chills, sweats, myalgias, or headache. Eye:  No redness, pain or drainage. ENT:  No earache, nasal congestion, sneezing, rhinorrhea, sinus pressure, sinus pain, or post nasal drip. Lungs:  No cough, sputum production, wheezing, shortness of breath, or chest pain. GI:  No abdominal pain, nausea, vomiting, or diarrhea. Skin:  No rash.  PMFSH   Past medical history, family history, social history, meds, and allergies were reviewed.   Physical Exam     Vital signs:  BP 110/75  Pulse 80  Temp(Src) 98.5 F (36.9 C) (Oral)  Resp 16  SpO2 99%  LMP 05/31/2013 General:  Alert, in no distress. Phonation was normal, no drooling, and patient was able to handle secretions well.  Eye:  No conjunctival injection or drainage. Lids were normal. ENT:  TMs and canals were normal, without erythema or inflammation.  Nasal mucosa was clear and uncongested, without drainage.  Mucous membranes were moist.  Exam of pharynx reveals tonsils to be enlarged and red with spots of white exudate.  There were no oral ulcerations or lesions. There was no bulging of the tonsillar pillars, and the uvula was midline. Neck:  Supple, she  has enlarged, tender anterior and posterior cervical adenopathy. Lungs:  No respiratory distress.  Lungs were clear to auscultation, without wheezes, rales or rhonchi.  Breath sounds were clear and equal bilaterally.  Heart:  Regular rhythm, without gallops, murmers or rubs. Skin:  Clear, warm, and dry, without rash or lesions.  Labs   Results for orders placed during the hospital encounter of 06/28/13  POCT RAPID STREP A ([facility] ONLY)      Result Value Ref Range   Streptococcus, Group A Screen (Direct) NEGATIVE  NEGATIVE  POCT INFECTIOUS MONO SCREEN      Result Value Ref Range   Mono Screen NEGATIVE  NEGATIVE   A GC and Chlamydia DNA probe of the throat were also obtained.  Assessment   The encounter diagnosis was Tonsillitis.  There is no evidence of a peritonsillar abscess.  She has tonsillitis that has not yet responded to oral amoxicillin, so it is time for a second line drugs. She was given clindamycin and prednisone. I will call her back with any positive results. Return in 3 or 4 days if no improvement.  Plan     1.  Meds:  The following meds were prescribed:   New Prescriptions   CLINDAMYCIN (CLEOCIN) 300 MG CAPSULE    Take 1 capsule (300 mg total) by mouth 4 (four) times daily.   PREDNISONE (DELTASONE) 20 MG TABLET    Take 1 tablet (20 mg total) by mouth 2 (two) times daily.  2.  Patient Education/Counseling:  The patient was given appropriate handouts, self care instructions, and instructed in symptomatic relief, including hot saline gargles, throat lozenges, infectious precautions, and need to trade out toothbrush.    3.  Follow up:  The patient was told to follow up here if no better in 3 to 4 days, or sooner if becoming worse in any way, and given some red flag symptoms such as difficulty swallowing or breathing which would prompt immediate return.       Reuben Likesavid C Eleazar Kimmey, MD 06/28/13 [phone]

## 2013-06-29 LAB — CYTOLOGY, (ORAL, ANAL, URETHRAL) ANCILLARY ONLY
CHLAMYDIA, DNA PROBE: NEGATIVE
NEISSERIA GONORRHEA: NEGATIVE

## 2013-06-30 LAB — CULTURE, GROUP A STREP

## 2013-07-04 NOTE — ED Provider Notes (Signed)
Medical screening examination/treatment/procedure(s) were performed by non-physician practitioner and as supervising physician I was immediately available for consultation/collaboration.   EKG Interpretation None        Layla MawKristen N Randall Rampersad, DO 07/04/13 [phone]

## 2013-07-19 ENCOUNTER — Emergency Department (HOSPITAL_COMMUNITY)
Admission: EM | Admit: 2013-07-19 | Discharge: 2013-07-19 | Disposition: A | Discharge status: Home / Self Care | Payer: Medicaid Other | Attending: Emergency Medicine | Admitting: Emergency Medicine

## 2013-07-19 ENCOUNTER — Emergency Department (HOSPITAL_COMMUNITY): Payer: Medicaid Other

## 2013-07-19 ENCOUNTER — Encounter (HOSPITAL_COMMUNITY): Payer: Self-pay | Admitting: Emergency Medicine

## 2013-07-19 DIAGNOSIS — S6990XA Unspecified injury of unspecified wrist, hand and finger(s), initial encounter: Secondary | ICD-10-CM | POA: Diagnosis not present

## 2013-07-19 DIAGNOSIS — F17200 Nicotine dependence, unspecified, uncomplicated: Secondary | ICD-10-CM | POA: Diagnosis not present

## 2013-07-19 DIAGNOSIS — IMO0002 Reserved for concepts with insufficient information to code with codable children: Secondary | ICD-10-CM | POA: Insufficient documentation

## 2013-07-19 DIAGNOSIS — Y9389 Activity, other specified: Secondary | ICD-10-CM | POA: Diagnosis not present

## 2013-07-19 DIAGNOSIS — M79645 Pain in left finger(s): Secondary | ICD-10-CM

## 2013-07-19 DIAGNOSIS — S0993XA Unspecified injury of face, initial encounter: Secondary | ICD-10-CM | POA: Insufficient documentation

## 2013-07-19 DIAGNOSIS — S6980XA Other specified injuries of unspecified wrist, hand and finger(s), initial encounter: Secondary | ICD-10-CM | POA: Insufficient documentation

## 2013-07-19 DIAGNOSIS — Z8679 Personal history of other diseases of the circulatory system: Secondary | ICD-10-CM | POA: Insufficient documentation

## 2013-07-19 DIAGNOSIS — Y92410 Unspecified street and highway as the place of occurrence of the external cause: Secondary | ICD-10-CM | POA: Insufficient documentation

## 2013-07-19 DIAGNOSIS — Z872 Personal history of diseases of the skin and subcutaneous tissue: Secondary | ICD-10-CM | POA: Insufficient documentation

## 2013-07-19 DIAGNOSIS — S199XXA Unspecified injury of neck, initial encounter: Secondary | ICD-10-CM

## 2013-07-19 MED ORDER — HYDROCODONE-ACETAMINOPHEN 5-325 MG PO TABS: 1.0000 | ORAL_TABLET | Freq: Four times a day (QID) | ORAL | Status: DC | PRN

## 2013-07-19 MED ORDER — NAPROXEN 500 MG PO TABS: 500.0000 mg | ORAL_TABLET | Freq: Two times a day (BID) | ORAL | Status: DC

## 2013-07-19 MED ORDER — IBUPROFEN 400 MG PO TABS
800.0000 mg | ORAL_TABLET | Freq: Once | ORAL | Status: AC
  Administered 2013-07-19: 800 mg via ORAL
  Filled 2013-07-19: qty 2
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
## 2013-07-19 NOTE — ED Notes (Signed)
Pt comfortable with discharge and follow up instructions. Prescriptions x2. 

## 2013-07-19 NOTE — ED Notes (Signed)
Spoke to West ConshohockenAbigail PA regarding pt's neck pain, states pt does not need cervical collar placed at this time.
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
## 2013-07-28 NOTE — ED Provider Notes (Signed)
Medical screening examination/treatment/procedure(s) were performed by non-physician practitioner and as supervising physician I was immediately available for consultation/collaboration.   EKG Interpretation None       Toy BakerAnthony T Mirella Gueye, MD 07/28/13 1815

## 2014-06-30 ENCOUNTER — Encounter (HOSPITAL_COMMUNITY): Payer: Self-pay | Admitting: Emergency Medicine

## 2014-06-30 ENCOUNTER — Emergency Department (INDEPENDENT_AMBULATORY_CARE_PROVIDER_SITE_OTHER)
Admission: EM | Admit: 2014-06-30 | Discharge: 2014-06-30 | Disposition: A | Discharge status: Home / Self Care | Payer: Self-pay | Source: Home / Self Care | Attending: Emergency Medicine | Admitting: Emergency Medicine

## 2014-06-30 DIAGNOSIS — K5900 Constipation, unspecified: Secondary | ICD-10-CM

## 2014-06-30 DIAGNOSIS — K649 Unspecified hemorrhoids: Secondary | ICD-10-CM

## 2014-06-30 MED ORDER — TUCKS 50 % EX PADS: MEDICATED_PAD | CUTANEOUS | Status: DC

## 2014-06-30 MED ORDER — DOCUSATE SODIUM 100 MG PO CAPS: 100.0000 mg | ORAL_CAPSULE | Freq: Two times a day (BID) | ORAL | Status: DC

## 2014-06-30 NOTE — Discharge Instructions (Signed)
Please use Preparation H as directed on the packaging. Increase dietary fiber in the form of fresh fruits and vegetables.  Constipation Constipation is when a person has fewer than three bowel movements a week, has difficulty having a bowel movement, or has stools that are dry, hard, or larger than normal. As people grow older, constipation is more common. If you try to fix constipation with medicines that make you have a bowel movement (laxatives), the problem may get worse. Long-term laxative use may cause the muscles of the colon to become weak. A low-fiber diet, not taking in enough fluids, and taking certain medicines may make constipation worse.  CAUSES   Certain medicines, such as antidepressants, pain medicine, iron supplements, antacids, and water pills.   Certain diseases, such as diabetes, irritable bowel syndrome (IBS), thyroid disease, or depression.   Not drinking enough water.   Not eating enough fiber-rich foods.   Stress or travel.   Lack of physical activity or exercise.   Ignoring the urge to have a bowel movement.   Using laxatives too much.  SIGNS AND SYMPTOMS   Having fewer than three bowel movements a week.   Straining to have a bowel movement.   Having stools that are hard, dry, or larger than normal.   Feeling full or bloated.   Pain in the lower abdomen.   Not feeling relief after having a bowel movement.  DIAGNOSIS  Your health care provider will take a medical history and perform a physical exam. Further testing may be done for severe constipation. Some tests may include:  A barium enema X-ray to examine your rectum, colon, and, sometimes, your small intestine.   A sigmoidoscopy to examine your lower colon.   A colonoscopy to examine your entire colon. TREATMENT  Treatment will depend on the severity of your constipation and what is causing it. Some dietary treatments include drinking more fluids and eating more fiber-rich foods.  Lifestyle treatments may include regular exercise. If these diet and lifestyle recommendations do not help, your health care provider may recommend taking over-the-counter laxative medicines to help you have bowel movements. Prescription medicines may be prescribed if over-the-counter medicines do not work.  HOME CARE INSTRUCTIONS   Eat foods that have a lot of fiber, such as fruits, vegetables, whole grains, and beans.  Limit foods high in fat and processed sugars, such as french fries, hamburgers, cookies, candies, and soda.   A fiber supplement may be added to your diet if you cannot get enough fiber from foods.   Drink enough fluids to keep your urine clear or pale yellow.   Exercise regularly or as directed by your health care provider.   Go to the restroom when you have the urge to go. Do not hold it.   Only take over-the-counter or prescription medicines as directed by your health care provider. Do not take other medicines for constipation without talking to your health care provider first.  SEEK IMMEDIATE MEDICAL CARE IF:   You have bright red blood in your stool.   Your constipation lasts for more than 4 days or gets worse.   You have abdominal or rectal pain.   You have thin, pencil-like stools.   You have unexplained weight loss. MAKE SURE YOU:   Understand these instructions.  Will watch your condition.  Will get help right away if you are not doing well or get worse. Document Released: 12/27/2003 Document Revised: 04/04/2013 Document Reviewed: 01/09/2013 [facility] Patient Information 2015 [employer] [place]. This  information is not intended to replace advice given to you by your health care provider. Make sure you discuss any questions you have with your health care provider.  Hemorrhoids Hemorrhoids are swollen veins around the rectum or anus. There are two types of hemorrhoids:   Internal hemorrhoids. These occur in the veins just inside the rectum. They  may poke through to the outside and become irritated and painful.  External hemorrhoids. These occur in the veins outside the anus and can be felt as a painful swelling or hard lump near the anus. CAUSES  Pregnancy.   Obesity.   Constipation or diarrhea.   Straining to have a bowel movement.   Sitting for long periods on the toilet.  Heavy lifting or other activity that caused you to strain.  Anal intercourse. SYMPTOMS   Pain.   Anal itching or irritation.   Rectal bleeding.   Fecal leakage.   Anal swelling.   One or more lumps around the anus.  DIAGNOSIS  Your caregiver may be able to diagnose hemorrhoids by visual examination. Other examinations or tests that may be performed include:   Examination of the rectal area with a gloved hand (digital rectal exam).   Examination of anal canal using a small tube (scope).   A blood test if you have lost a significant amount of blood.  A test to look inside the colon (sigmoidoscopy or colonoscopy). TREATMENT Most hemorrhoids can be treated at home. However, if symptoms do not seem to be getting better or if you have a lot of rectal bleeding, your caregiver may perform a procedure to help make the hemorrhoids get smaller or remove them completely. Possible treatments include:   Placing a rubber band at the base of the hemorrhoid to cut off the circulation (rubber band ligation).   Injecting a chemical to shrink the hemorrhoid (sclerotherapy).   Using a tool to burn the hemorrhoid (infrared light therapy).   Surgically removing the hemorrhoid (hemorrhoidectomy).   Stapling the hemorrhoid to block blood flow to the tissue (hemorrhoid stapling).  HOME CARE INSTRUCTIONS   Eat foods with fiber, such as whole grains, beans, nuts, fruits, and vegetables. Ask your doctor about taking products with added fiber in them (fibersupplements).  Increase fluid intake. Drink enough water and fluids to keep your urine  clear or pale yellow.   Exercise regularly.   Go to the bathroom when you have the urge to have a bowel movement. Do not wait.   Avoid straining to have bowel movements.   Keep the anal area dry and clean. Use wet toilet paper or moist towelettes after a bowel movement.   Medicated creams and suppositories may be used or applied as directed.   Only take over-the-counter or prescription medicines as directed by your caregiver.   Take warm sitz baths for 15-20 minutes, 3-4 times a day to ease pain and discomfort.   Place ice packs on the hemorrhoids if they are tender and swollen. Using ice packs between sitz baths may be helpful.   Put ice in a plastic bag.   Place a towel between your skin and the bag.   Leave the ice on for 15-20 minutes, 3-4 times a day.   Do not use a donut-shaped pillow or sit on the toilet for long periods. This increases blood pooling and pain.  SEEK MEDICAL CARE IF:  You have increasing pain and swelling that is not controlled by treatment or medicine.  You have uncontrolled bleeding.  You  have difficulty or you are unable to have a bowel movement.  You have pain or inflammation outside the area of the hemorrhoids. MAKE SURE YOU:  Understand these instructions.  Will watch your condition.  Will get help right away if you are not doing well or get worse. Document Released: 03/27/2000 Document Revised: 03/16/2012 Document Reviewed: 02/02/2012 [facility] Patient Information 2015 [place], [place]. This information is not intended to replace advice given to you by your health care provider. Make sure you discuss any questions you have with your health care provider.

## 2014-06-30 NOTE — ED Notes (Signed)
Pt has had constipation for 4 days. Now has rectal pain and pressure. AU/RMA

## 2014-06-30 NOTE — ED Provider Notes (Signed)
CSN: [id number]     Arrival date & time 06/30/14  1623 History   First MD Initiated Contact with Patient 06/30/14 1744     Chief Complaint  Patient presents with  . Rectal Pain   (Consider location/radiation/quality/duration/timing/severity/associated sxs/prior Treatment) HPI Comments: Patient presents for evaluation of 5 days of rectal irritation. States she is often constipated and has been constipated this week and noticed a mildly tender swollen area at her rectum over the past 2-3 days. No rectal bleeding. No previous episodes. States she eats quite a bit or red meat and little to no fruits and vegetables.   The history is provided by the patient.    Past Medical History  Diagnosis Date  . Eczema   . Migraine    History reviewed. No pertinent past surgical history. No family history on file. History  Substance Use Topics  . Smoking status: Current Some Day Smoker    Types: Cigars  . Smokeless tobacco: Not on file  . Alcohol Use: Yes   OB History    No data available     Review of Systems  All other systems reviewed and are negative.   Allergies  Review of patient's allergies indicates no known allergies.  Home Medications   Prior to Admission medications   Medication Sig Start Date End Date Taking? Authorizing Provider  docusate sodium (COLACE) 100 MG capsule Take 1 capsule (100 mg total) by mouth every 12 (twelve) hours. 06/30/14   Mathis FareJennifer Lee H Mertice Uffelman, PA  HYDROcodone-acetaminophen (NORCO) 5-325 MG per tablet Take 1-2 tablets by mouth every 6 (six) hours as needed for moderate pain. 07/19/13   Arthor CaptainAbigail Palacios, PA-C  naproxen (NAPROSYN) 500 MG tablet Take 1 tablet (500 mg total) by mouth 2 (two) times daily with a meal. 07/19/13   Arthor CaptainAbigail Lieurance, PA-C  Witch Hazel (TUCKS) 50 % PADS As directed 06/30/14   Mathis FareJennifer Lee H Mariadelaluz Guggenheim, PA   BP 95/49 mmHg  Pulse 83  Temp(Src) 98.3 F (36.8 C) (Oral)  SpO2 100%  LMP 06/26/2014 Physical Exam  Constitutional: She is  oriented to person, place, and time. She appears well-developed and well-nourished. No distress.  HENT:  Head: Normocephalic and atraumatic.  Eyes: Conjunctivae are normal.  Cardiovascular: Normal rate.   Pulmonary/Chest: Effort normal.  Genitourinary:    Pelvic exam was performed with patient supine.  Neurological: She is alert and oriented to person, place, and time.  Skin: Skin is warm and dry.  Psychiatric: She has a normal mood and affect. Her behavior is normal.  Nursing note and vitals reviewed.   ED Course  Procedures (including critical care time) Labs Review Labs Reviewed - No data to display  Imaging Review No results found.   MDM   1. Constipation, unspecified constipation type   2. Hemorrhoids, unspecified hemorrhoid type   Tucks pads, colace and  Preparation H as directed on the packaging. Increase dietary fiber in the form of fresh fruits and vegetables.    Ria ClockJennifer Lee H Sylva Overley, [place]PA 06/30/14 [id number]

## 2014-10-26 ENCOUNTER — Encounter (HOSPITAL_COMMUNITY): Payer: Self-pay | Admitting: *Deleted

## 2014-10-26 ENCOUNTER — Other Ambulatory Visit (HOSPITAL_COMMUNITY)
Admission: RE | Admit: 2014-10-26 | Discharge: 2014-10-26 | Disposition: A | Discharge status: Home / Self Care | Payer: Medicaid Other | Source: Ambulatory Visit | Attending: Family Medicine | Admitting: Family Medicine

## 2014-10-26 ENCOUNTER — Emergency Department (INDEPENDENT_AMBULATORY_CARE_PROVIDER_SITE_OTHER)
Admission: EM | Admit: 2014-10-26 | Discharge: 2014-10-26 | Disposition: A | Discharge status: Home / Self Care | Payer: Self-pay | Source: Home / Self Care | Attending: Family Medicine | Admitting: Family Medicine

## 2014-10-26 DIAGNOSIS — N760 Acute vaginitis: Secondary | ICD-10-CM | POA: Diagnosis present

## 2014-10-26 DIAGNOSIS — Z113 Encounter for screening for infections with a predominantly sexual mode of transmission: Secondary | ICD-10-CM | POA: Insufficient documentation

## 2014-10-26 DIAGNOSIS — B3731 Acute candidiasis of vulva and vagina: Secondary | ICD-10-CM

## 2014-10-26 DIAGNOSIS — B373 Candidiasis of vulva and vagina: Secondary | ICD-10-CM

## 2014-10-26 LAB — POCT URINALYSIS DIP (DEVICE)
BILIRUBIN URINE: NEGATIVE
Glucose, UA: NEGATIVE mg/dL
HGB URINE DIPSTICK: NEGATIVE
KETONES UR: NEGATIVE mg/dL
LEUKOCYTES UA: NEGATIVE
Nitrite: NEGATIVE
PH: 7 (ref 5.0–8.0)
Protein, ur: NEGATIVE mg/dL
Specific Gravity, Urine: 1.02 (ref 1.005–1.030)
UROBILINOGEN UA: 0.2 mg/dL (ref 0.0–1.0)

## 2014-10-26 LAB — POCT PREGNANCY, URINE: PREG TEST UR: NEGATIVE

## 2014-10-26 MED ORDER — FLUCONAZOLE 150 MG PO TABS: 150.0000 mg | ORAL_TABLET | Freq: Every day | ORAL | Status: DC

## 2014-10-26 NOTE — ED Provider Notes (Signed)
CSN: [id number]     Arrival date & time 10/26/14  1418 History   First MD Initiated Contact with Patient 10/26/14 1445     Chief Complaint  Patient presents with  . Urinary Tract Infection   (Consider location/radiation/quality/duration/timing/severity/associated sxs/prior Treatment) Patient is a 24 y.o. female presenting with vaginal discharge. The history is provided by the patient. No language interpreter was used.  Vaginal Discharge Quality:  White Severity:  Moderate Onset quality:  Gradual Duration:  1 week Timing:  Constant Progression:  Worsening Chronicity:  New Context: not after intercourse   Relieved by:  Nothing Worsened by:  Nothing tried Ineffective treatments:  None tried Associated symptoms: abdominal pain and vaginal itching   Risk factors: no STI exposure     Past Medical History  Diagnosis Date  . Eczema   . Migraine    History reviewed. No pertinent past surgical history. History reviewed. No pertinent family history. History  Substance Use Topics  . Smoking status: Current Some Day Smoker    Types: Cigars  . Smokeless tobacco: Not on file  . Alcohol Use: Yes   OB History    No data available     Review of Systems  Gastrointestinal: Positive for abdominal pain.  Genitourinary: Positive for vaginal discharge.  All other systems reviewed and are negative.   Allergies  Review of patient's allergies indicates no known allergies.  Home Medications   Prior to Admission medications   Medication Sig Start Date End Date Taking? Authorizing Provider  docusate sodium (COLACE) 100 MG capsule Take 1 capsule (100 mg total) by mouth every 12 (twelve) hours. 06/30/14   Mathis FareJennifer Lee H Presson, PA  HYDROcodone-acetaminophen (NORCO) 5-325 MG per tablet Take 1-2 tablets by mouth every 6 (six) hours as needed for moderate pain. 07/19/13   Arthor CaptainAbigail Hopson, PA-C  naproxen (NAPROSYN) 500 MG tablet Take 1 tablet (500 mg total) by mouth 2 (two) times daily with a  meal. 07/19/13   Arthor CaptainAbigail Werntz, PA-C  Witch Hazel (TUCKS) 50 % PADS As directed 06/30/14   Mathis FareJennifer Lee H Presson, PA   BP 108/71 mmHg  Pulse 92  Temp(Src) 98.8 F (37.1 C) (Oral)  Resp 12  SpO2 100%  LMP 10/09/2014 Physical Exam  Constitutional: She appears well-developed and well-nourished.  HENT:  Head: Normocephalic.  Eyes: Pupils are equal, round, and reactive to light.  Neck: Normal range of motion.  Cardiovascular: Normal rate.   Pulmonary/Chest: Effort normal.  Abdominal: Soft.  Genitourinary: Vaginal discharge found.  Adnexa nontender, no mass  Musculoskeletal: Normal range of motion.  Neurological: She is alert.  Skin: Skin is warm.    ED Course  Procedures (including critical care time) Labs Review Labs Reviewed  POCT URINALYSIS DIP (DEVICE)  POCT PREGNANCY, URINE  CERVICOVAGINAL ANCILLARY ONLY    Imaging Review No results found.   MDM   1. Yeast vaginitis    Diflucan avs Return if any problems.    Lonia SkinnerLeslie K RockwoodSofia, PA-C 10/26/14 1640

## 2014-10-26 NOTE — Discharge Instructions (Signed)

## 2014-10-26 NOTE — ED Notes (Signed)
Pt  Reports  Symptoms  Of  low  abd  Pain     With   Burning  On  Urination         Dark  Urine  Color             Symptoms   Since  Last  Week

## 2014-10-29 LAB — CERVICOVAGINAL ANCILLARY ONLY
Chlamydia: NEGATIVE
Neisseria Gonorrhea: NEGATIVE

## 2014-10-30 LAB — CERVICOVAGINAL ANCILLARY ONLY: Wet Prep (BD Affirm): NEGATIVE

## 2014-10-30 NOTE — ED Notes (Signed)
Final lab report negative for yeast, gardnerella, GC, chlamydia, trich

## 2014-11-27 ENCOUNTER — Emergency Department (HOSPITAL_COMMUNITY)
Admission: EM | Admit: 2014-11-27 | Discharge: 2014-11-27 | Discharge status: Against Medical Advice | Payer: Medicaid Other

## 2014-11-27 NOTE — ED Notes (Signed)
Pt called with no answer
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
## 2015-03-08 ENCOUNTER — Encounter (HOSPITAL_COMMUNITY): Payer: Self-pay | Admitting: *Deleted

## 2015-03-08 ENCOUNTER — Emergency Department (HOSPITAL_COMMUNITY)
Admission: EM | Admit: 2015-03-08 | Discharge: 2015-03-08 | Disposition: A | Discharge status: Home / Self Care | Payer: Medicaid Other | Attending: Emergency Medicine | Admitting: Emergency Medicine

## 2015-03-08 DIAGNOSIS — K2970 Gastritis, unspecified, without bleeding: Secondary | ICD-10-CM | POA: Insufficient documentation

## 2015-03-08 DIAGNOSIS — F419 Anxiety disorder, unspecified: Secondary | ICD-10-CM | POA: Insufficient documentation

## 2015-03-08 DIAGNOSIS — F1010 Alcohol abuse, uncomplicated: Secondary | ICD-10-CM | POA: Insufficient documentation

## 2015-03-08 DIAGNOSIS — Z872 Personal history of diseases of the skin and subcutaneous tissue: Secondary | ICD-10-CM | POA: Insufficient documentation

## 2015-03-08 DIAGNOSIS — Z8679 Personal history of other diseases of the circulatory system: Secondary | ICD-10-CM | POA: Insufficient documentation

## 2015-03-08 DIAGNOSIS — R61 Generalized hyperhidrosis: Secondary | ICD-10-CM | POA: Insufficient documentation

## 2015-03-08 DIAGNOSIS — F17200 Nicotine dependence, unspecified, uncomplicated: Secondary | ICD-10-CM | POA: Insufficient documentation

## 2015-03-08 LAB — CBC WITH DIFFERENTIAL/PLATELET
BASOS PCT: 1 %
Basophils Absolute: 0 10*3/uL (ref 0.0–0.1)
EOS PCT: 1 %
Eosinophils Absolute: 0.1 10*3/uL (ref 0.0–0.7)
HCT: 38.3 % (ref 36.0–46.0)
Hemoglobin: 13.7 g/dL (ref 12.0–15.0)
LYMPHS ABS: 2.4 10*3/uL (ref 0.7–4.0)
Lymphocytes Relative: 39 %
MCH: 30 pg (ref 26.0–34.0)
MCHC: 35.8 g/dL (ref 30.0–36.0)
MCV: 84 fL (ref 78.0–100.0)
MONOS PCT: 3 %
Monocytes Absolute: 0.2 10*3/uL (ref 0.1–1.0)
Neutro Abs: 3.5 10*3/uL (ref 1.7–7.7)
Neutrophils Relative %: 56 %
PLATELETS: 271 10*3/uL (ref 150–400)
RBC: 4.56 MIL/uL (ref 3.87–5.11)
RDW: 13.4 % (ref 11.5–15.5)
WBC: 6.2 10*3/uL (ref 4.0–10.5)

## 2015-03-08 LAB — LIPASE, BLOOD: LIPASE: 29 U/L (ref 11–51)

## 2015-03-08 LAB — COMPREHENSIVE METABOLIC PANEL
ALK PHOS: 43 U/L (ref 38–126)
ALT: 10 U/L — ABNORMAL LOW (ref 14–54)
ANION GAP: 12 (ref 5–15)
AST: 20 U/L (ref 15–41)
Albumin: 4.8 g/dL (ref 3.5–5.0)
BUN: 6 mg/dL (ref 6–20)
CHLORIDE: 103 mmol/L (ref 101–111)
CO2: 23 mmol/L (ref 22–32)
Calcium: 9.8 mg/dL (ref 8.9–10.3)
Creatinine, Ser: 0.88 mg/dL (ref 0.44–1.00)
GFR calc non Af Amer: >60 mL/min (ref >60)
Glucose, Bld: 143 mg/dL — ABNORMAL HIGH (ref 65–99)
Potassium: 4.5 mmol/L (ref 3.5–5.1)
SODIUM: 138 mmol/L (ref 135–145)
Total Bilirubin: 0.3 mg/dL (ref 0.3–1.2)
Total Protein: 8.1 g/dL (ref 6.5–8.1)

## 2015-03-08 LAB — ETHANOL: Alcohol, Ethyl (B): <5 mg/dL (ref <5)

## 2015-03-08 LAB — CBG MONITORING, ED: GLUCOSE-CAPILLARY: 122 mg/dL — AB (ref 65–99)

## 2015-03-08 MED ORDER — DICYCLOMINE HCL 20 MG PO TABS: 20.0000 mg | ORAL_TABLET | Freq: Two times a day (BID) | ORAL | Status: DC

## 2015-03-08 MED ORDER — SODIUM CHLORIDE 0.9 % IV BOLUS (SEPSIS)
1000.0000 mL | Freq: Once | INTRAVENOUS | Status: AC
  Administered 2015-03-08: 1000 mL via INTRAVENOUS

## 2015-03-08 MED ORDER — ONDANSETRON 8 MG PO TBDP: 8.0000 mg | ORAL_TABLET | Freq: Three times a day (TID) | ORAL | Status: DC | PRN

## 2015-03-08 MED ORDER — FENTANYL CITRATE (PF) 100 MCG/2ML IJ SOLN
100.0000 ug | Freq: Once | INTRAMUSCULAR | Status: DC
  Filled 2015-03-08: qty 2

## 2015-03-08 MED ORDER — LORAZEPAM 2 MG/ML IJ SOLN
0.5000 mg | Freq: Once | INTRAMUSCULAR | Status: AC
  Administered 2015-03-08: 0.5 mg via INTRAVENOUS
  Filled 2015-03-08: qty 1

## 2015-03-08 MED ORDER — ONDANSETRON 4 MG PO TBDP
4.0000 mg | ORAL_TABLET | Freq: Once | ORAL | Status: AC
  Administered 2015-03-08: 4 mg via ORAL
  Filled 2015-03-08: qty 1

## 2015-03-08 NOTE — ED Notes (Signed)
Pt. Vomited again , approximately 30cc of  Yellow fluid

## 2015-03-08 NOTE — ED Notes (Signed)
Pt cbg 122 

## 2015-03-08 NOTE — ED Notes (Signed)
Pt. Is aware of needing an urine specimen, Her significant other reports that she is unable to given urine specimen

## 2015-03-08 NOTE — Discharge Instructions (Signed)

## 2015-03-08 NOTE — ED Notes (Signed)
Pt aware of urine sample needed. Pt unable to go at this time. 

## 2015-03-08 NOTE — ED Provider Notes (Signed)
CSN: [id number]     Arrival date & time 03/08/15  [id number] History   First MD Initiated Contact with Patient 03/08/15 0701     Chief Complaint  Patient presents with  . Emesis     (Consider location/radiation/quality/duration/timing/severity/associated sxs/prior Treatment) HPI   Patient to the ER with friend complaining of severe diffuse abdominal pain and vomiting. She had Thanksgiving lunch/dinner yesterday and was feeling fine at that point. She started drinking alcohol late in the night. She does not typically drink a lot of alcohol but her friend said that she was mixing a lot of liquor drinks, she is unsure of how much she had. A few hours ago she started to vomit, she is also having a lot of burping and dry heaving. She believes she is due to start her menstrual cycle soon. Her symptoms are accompanied by feeling weak, hot flashes and cold chills. The patient is tearful and "balled up" with her knees to chest with abdominal pain.   Past Medical History  Diagnosis Date  . Eczema   . Migraine    History reviewed. No pertinent past surgical history. History reviewed. No pertinent family history. Social History  Substance Use Topics  . Smoking status: Current Some Day Smoker    Types: Cigars  . Smokeless tobacco: None  . Alcohol Use: Yes   OB History    No data available     Review of Systems  Level V caveat- ETOH   Allergies  Review of patient's allergies indicates no known allergies.  Home Medications   Prior to Admission medications   Medication Sig Start Date End Date Taking? Authorizing Provider  dicyclomine (BENTYL) 20 MG tablet Take 1 tablet (20 mg total) by mouth 2 (two) times daily. 03/08/15   Marlon Peliffany Adarrius Graeff, PA-C  docusate sodium (COLACE) 100 MG capsule Take 1 capsule (100 mg total) by mouth every 12 (twelve) hours. Patient not taking: Reported on 03/08/2015 06/30/14   Mathis FareJennifer Lee H Presson, PA  fluconazole (DIFLUCAN) 150 MG tablet Take 1 tablet (150 mg total)  by mouth daily. Patient not taking: Reported on 03/08/2015 10/26/14   Elson AreasLeslie K Sofia, PA-C  HYDROcodone-acetaminophen [facility](NORCO) 5-325 MG per tablet Take 1-2 tablets by mouth every 6 (six) hours as needed for moderate pain. Patient not taking: Reported on 03/08/2015 07/19/13   Arthor CaptainAbigail Recine, PA-C  naproxen (NAPROSYN) 500 MG tablet Take 1 tablet (500 mg total) by mouth 2 (two) times daily with a meal. Patient not taking: Reported on 03/08/2015 07/19/13   Arthor CaptainAbigail Turek, PA-C  ondansetron (ZOFRAN ODT) 8 MG disintegrating tablet Take 1 tablet (8 mg total) by mouth every 8 (eight) hours as needed for nausea or vomiting. 03/08/15   Rhiley Solem Neva SeatGreene, PA-C  Witch Hazel (TUCKS) 50 % PADS As directed Patient not taking: Reported on 03/08/2015 06/30/14   Mathis FareJennifer Lee H Presson, PA   BP 117/80 mmHg  Pulse 65  Temp(Src) 99.7 F (37.6 C) (Oral)  Resp 18  Ht 5\' 3"  (1.6 m)  Wt 49.896 kg  BMI 19.49 kg/m2  SpO2 99%  LMP 02/05/2015 Physical Exam  Constitutional: She appears well-developed and well-nourished. She appears distressed.  HENT:  Head: Normocephalic and atraumatic.  Eyes: Pupils are equal, round, and reactive to light.  Neck: Normal range of motion. Neck supple.  Cardiovascular: Normal rate and regular rhythm.   Pulmonary/Chest: Effort normal.  Abdominal: Soft. Bowel sounds are normal. She exhibits no distension. There is tenderness (diffuse). There is guarding (voluntary). There is no rigidity,  no rebound, no CVA tenderness, no tenderness at McBurney's point and negative Murphy's sign.  Musculoskeletal:  No lower extremity swelling  Neurological: She is alert.  Skin: Skin is warm. She is diaphoretic.  Psychiatric: Her mood appears anxious.  + tearful  Nursing note and vitals reviewed.   ED Course  Procedures (including critical care time) Labs Review Labs Reviewed  COMPREHENSIVE METABOLIC PANEL - Abnormal; Notable for the following:    Glucose, Bld 143 (*)    ALT 10 (*)    All other  components within normal limits  CBG MONITORING, ED - Abnormal; Notable for the following:    Glucose-Capillary 122 (*)    All other components within normal limits  LIPASE, BLOOD  ETHANOL  CBC WITH DIFFERENTIAL/PLATELET    Imaging Review No results found. I have personally reviewed and evaluated these images and lab results as part of my medical decision-making.   EKG Interpretation None      MDM   Final diagnoses:  Gastritis    Patients labs have resulted and she does not have any alcohol in her system at this time. She was drinking heavily the night before however. Her symptoms are consistent with cyclical vomiting and gastritis. Her lipase, CBC and CMP are unremarkable which is reassuring. She was given fluids, nausea medication and Ativan which helped her symptoms. She started feeling better, resting comfortably. Tolerated fluid challenge.  Medications  ondansetron (ZOFRAN-ODT) disintegrating tablet 4 mg (4 mg Oral Given 03/08/15 0817)  sodium chloride 0.9 % bolus 1,000 mL (1,000 mLs Intravenous New Bag/Given 03/08/15 0818)  LORazepam (ATIVAN) injection 0.5 mg (0.5 mg Intravenous Given 03/08/15 0913)     I feel the patient has had an appropriate workup for their chief complaint at this time and likelihood of emergent condition existing is low. Discussed s/sx that warrant return to the ED.  Filed Vitals:   03/08/15 0832 03/08/15 0845  BP: 116/94 117/80  Pulse: 65 65  Temp:    Resp: [id number], PA-C 03/08/15 4098  Linwood Dibbles, MD 03/08/15 [phone]

## 2015-03-08 NOTE — ED Notes (Signed)
Pt. Given gingerale.

## 2015-03-08 NOTE — ED Notes (Signed)
Pt. Started vomiting a couple of hours ago and hasnt been able to keep anything down. ETOH on board

## 2015-03-08 NOTE — ED Notes (Signed)
After pt. Received her Zofran she vomited approximately 100cc of fluid

## 2015-10-03 ENCOUNTER — Other Ambulatory Visit: Payer: Self-pay

## 2015-10-03 ENCOUNTER — Encounter (HOSPITAL_COMMUNITY): Payer: Self-pay | Admitting: Neurology

## 2015-10-03 ENCOUNTER — Emergency Department (HOSPITAL_COMMUNITY)
Admission: EM | Admit: 2015-10-03 | Discharge: 2015-10-03 | Disposition: A | Discharge status: Home / Self Care | Payer: Medicaid Other | Attending: Emergency Medicine | Admitting: Emergency Medicine

## 2015-10-03 DIAGNOSIS — K2100 Gastro-esophageal reflux disease with esophagitis, without bleeding: Secondary | ICD-10-CM

## 2015-10-03 DIAGNOSIS — R002 Palpitations: Secondary | ICD-10-CM | POA: Insufficient documentation

## 2015-10-03 DIAGNOSIS — I441 Atrioventricular block, second degree: Secondary | ICD-10-CM

## 2015-10-03 DIAGNOSIS — F17210 Nicotine dependence, cigarettes, uncomplicated: Secondary | ICD-10-CM | POA: Insufficient documentation

## 2015-10-03 MED ORDER — OMEPRAZOLE 20 MG PO CPDR: 20.0000 mg | DELAYED_RELEASE_CAPSULE | Freq: Every day | ORAL | Status: DC

## 2015-10-03 MED ORDER — PANTOPRAZOLE SODIUM 40 MG PO TBEC
40.0000 mg | DELAYED_RELEASE_TABLET | Freq: Once | ORAL | Status: AC
  Administered 2015-10-03: 40 mg via ORAL
  Filled 2015-10-03: qty 1

## 2015-10-03 NOTE — Discharge Instructions (Signed)
Follow-up with cardiology regarding your irregular heart rhythm. Avoid alcohol, tobacco, caffeine, anti-inflammatories. No food before lying down to sleep.   Gastroesophageal Reflux Disease, Adult Normally, food travels down the esophagus and stays in the stomach to be digested. However, when a person has gastroesophageal reflux disease (GERD), food and stomach acid move back up into the esophagus. When this happens, the esophagus becomes sore and inflamed. Over time, GERD can create small holes (ulcers) in the lining of the esophagus.  CAUSES This condition is caused by a problem with the muscle between the esophagus and the stomach (lower esophageal sphincter, or LES). Normally, the LES muscle closes after food passes through the esophagus to the stomach. When the LES is weakened or abnormal, it does not close properly, and that allows food and stomach acid to go back up into the esophagus. The LES can be weakened by certain dietary substances, medicines, and medical conditions, including:  Tobacco use.  Pregnancy.  Having a hiatal hernia.  Heavy alcohol use.  Certain foods and beverages, such as coffee, chocolate, onions, and peppermint. RISK FACTORS This condition is more likely to develop in:  People who have an increased body weight.  People who have connective tissue disorders.  People who use NSAID medicines. SYMPTOMS Symptoms of this condition include:  Heartburn.  Difficult or painful swallowing.  The feeling of having a lump in the throat.  Abitter taste in the mouth.  Bad breath.  Having a large amount of saliva.  Having an upset or bloated stomach.  Belching.  Chest pain.  Shortness of breath or wheezing.  Ongoing (chronic) cough or a night-time cough.  Wearing away of tooth enamel.  Weight loss. Different conditions can cause chest pain. Make sure to see your health care provider if you experience chest pain. DIAGNOSIS Your health care provider  will take a medical history and perform a physical exam. To determine if you have mild or severe GERD, your health care provider may also monitor how you respond to treatment. You may also have other tests, including:  An endoscopy toexamine your stomach and esophagus with a small camera.  A test thatmeasures the acidity level in your esophagus.  A test thatmeasures how much pressure is on your esophagus.  A barium swallow or modified barium swallow to show the shape, size, and functioning of your esophagus. TREATMENT The goal of treatment is to help relieve your symptoms and to prevent complications. Treatment for this condition may vary depending on how severe your symptoms are. Your health care provider may recommend:  Changes to your diet.  Medicine.  Surgery. HOME CARE INSTRUCTIONS Diet  Follow a diet as recommended by your health care provider. This may involve avoiding foods and drinks such as:  Coffee and tea (with or without caffeine).  Drinks that containalcohol.  Energy drinks and sports drinks.  Carbonated drinks or sodas.  Chocolate and cocoa.  Peppermint and mint flavorings.  Garlic and onions.  Horseradish.  Spicy and acidic foods, including peppers, chili powder, curry powder, vinegar, hot sauces, and barbecue sauce.  Citrus fruit juices and citrus fruits, such as oranges, lemons, and limes.  Tomato-based foods, such as red sauce, chili, salsa, and pizza with red sauce.  Fried and fatty foods, such as donuts, french fries, potato chips, and high-fat dressings.  High-fat meats, such as hot dogs and fatty cuts of red and white meats, such as rib eye steak, sausage, ham, and bacon.  High-fat dairy items, such as whole milk,  butter, and cream cheese.  Eat small, frequent meals instead of large meals.  Avoid drinking large amounts of liquid with your meals.  Avoid eating meals during the 2-3 hours before bedtime.  Avoid lying down right after you  eat.  Do not exercise right after you eat. General Instructions  Pay attention to any changes in your symptoms.  Take over-the-counter and prescription medicines only as told by your health care provider. Do not take aspirin, ibuprofen, or other NSAIDs unless your health care provider told you to do so.  Do not use any tobacco products, including cigarettes, chewing tobacco, and e-cigarettes. If you need help quitting, ask your health care provider.  Wear loose-fitting clothing. Do not wear anything tight around your waist that causes pressure on your abdomen.  Raise (elevate) the head of your bed 6 inches (15cm).  Try to reduce your stress, such as with yoga or meditation. If you need help reducing stress, ask your health care provider.  If you are overweight, reduce your weight to an amount that is healthy for you. Ask your health care provider for guidance about a safe weight loss goal.  Keep all follow-up visits as told by your health care provider. This is important. SEEK MEDICAL CARE IF:  You have new symptoms.  You have unexplained weight loss.  You have difficulty swallowing, or it hurts to swallow.  You have wheezing or a persistent cough.  Your symptoms do not improve with treatment.  You have a hoarse voice. SEEK IMMEDIATE MEDICAL CARE IF:  You have pain in your arms, neck, jaw, teeth, or back.  You feel sweaty, dizzy, or light-headed.  You have chest pain or shortness of breath.  You vomit and your vomit looks like blood or coffee grounds.  You faint.  Your stool is bloody or black.  You cannot swallow, drink, or eat.   This information is not intended to replace advice given to you by your health care provider. Make sure you discuss any questions you have with your health care provider.   Document Released: 01/07/2005 Document Revised: 12/19/2014 Document Reviewed: 07/25/2014 Elsevier Interactive Patient Education [employer]

## 2015-10-03 NOTE — ED Provider Notes (Signed)
CSN: [id number]     Arrival date & time 10/03/15  [id number] History   First MD Initiated Contact with Patient 10/03/15 0703     Chief Complaint  Patient presents with  . Gastroesophageal Reflux  . Palpitations      HPI  Patient presents evaluation of essentially 2 complaints. One is that for over a month she's had episodes where she will feel like her heart is irregular. Particularly at night. Does not have chest pain or shortness of breath or sensation of tachycardia palpitations. Also states that for the last week she started having "reflux". States she has discomfort in her "chest" however she indicates her upper abdomen. States it is described as a burning. She smokes. Drinks occasionally. One caffeinated beverage per day. Infrequent anti-inflammatories. No fatty food intolerances. No biliary colic symptoms. No nausea vomiting diarrhea.  Past Medical History  Diagnosis Date  . Eczema   . Migraine    History reviewed. No pertinent past surgical history. No family history on file. Social History  Substance Use Topics  . Smoking status: Current Some Day Smoker    Types: Cigars  . Smokeless tobacco: None  . Alcohol Use: Yes   OB History    No data available     Review of Systems  Constitutional: Negative for fever, chills, diaphoresis, appetite change and fatigue.  HENT: Negative for mouth sores, sore throat and trouble swallowing.   Eyes: Negative for visual disturbance.  Respiratory: Negative for cough, chest tightness, shortness of breath and wheezing.   Cardiovascular: Positive for palpitations. Negative for chest pain.  Gastrointestinal: Positive for nausea and abdominal pain. Negative for vomiting, diarrhea and abdominal distention.  Endocrine: Negative for polydipsia, polyphagia and polyuria.  Genitourinary: Negative for dysuria, frequency and hematuria.  Musculoskeletal: Negative for gait problem.  Skin: Negative for color change, pallor and rash.  Neurological: Negative  for dizziness, syncope, light-headedness and headaches.  Hematological: Does not bruise/bleed easily.  Psychiatric/Behavioral: Negative for behavioral problems and confusion.      Allergies  Review of patient's allergies indicates no known allergies.  Home Medications   Prior to Admission medications   Medication Sig Start Date End Date Taking? Authorizing Provider  omeprazole (PRILOSEC) 20 MG capsule Take 1 capsule (20 mg total) by mouth daily. 10/03/15   Rolland PorterMark Krist Rosenboom, MD   BP 112/65 mmHg  Pulse 65  Temp(Src) 98.5 F (36.9 C) (Oral)  Resp 14  Ht 5\' 5"  (1.651 m)  Wt 115 lb (52.164 kg)  BMI 19.14 kg/m2  SpO2 100%  LMP 09/14/2015 Physical Exam  Constitutional: She is oriented to person, place, and time. She appears well-developed and well-nourished. No distress.  HENT:  Head: Normocephalic.  Eyes: Conjunctivae are normal. Pupils are equal, round, and reactive to light. No scleral icterus.  Neck: Normal range of motion. Neck supple. No thyromegaly present.  Cardiovascular: Normal rate.  An irregular rhythm present. Exam reveals no gallop and no friction rub.   No murmur heard. Second-degree type I AV block  Pulmonary/Chest: Effort normal and breath sounds normal. No respiratory distress. She has no wheezes. She has no rales.  Abdominal: Soft. Bowel sounds are normal. She exhibits no distension. There is no tenderness. There is no rebound.    Musculoskeletal: Normal range of motion.  Neurological: She is alert and oriented to person, place, and time.  Skin: Skin is warm and dry. No rash noted.  Psychiatric: She has a normal mood and affect. Her behavior is normal.    ED  Course  Procedures (including critical care time) Labs Review Labs Reviewed - No data to display  Imaging Review No results found. I have personally reviewed and evaluated these images and lab results as part of my medical decision-making.    MDM   Final diagnoses:  Gastroesophageal reflux disease  with esophagitis  Mobitz (type) I (Wenckebach's) atrioventricular block    Patient with classic reflux symptoms. Describes occasional feeling of irregularity of her heart. Has a Wenckebach/Mobitz 2 rhythm. Is a symptomatic to it now. Given her allergy referral. Reflux precautions, information, prescription Prilosec. Stop anti-inflammatories, all tobacco caffeine. Small meals. No food before laying supine. ER with acute changes.    Rolland PorterMark Deliliah Spranger, MD 10/03/15 [phone]

## 2015-10-03 NOTE — ED Notes (Signed)
Pt reports 1 week ago started having reflux after eating and cp. Also having palpations when she lays back. Denies pain at current.

## 2016-05-28 ENCOUNTER — Encounter (HOSPITAL_COMMUNITY): Payer: Self-pay

## 2016-05-28 DIAGNOSIS — H9202 Otalgia, left ear: Secondary | ICD-10-CM | POA: Insufficient documentation

## 2016-05-28 DIAGNOSIS — K0889 Other specified disorders of teeth and supporting structures: Secondary | ICD-10-CM | POA: Insufficient documentation

## 2016-05-28 DIAGNOSIS — Z87891 Personal history of nicotine dependence: Secondary | ICD-10-CM | POA: Insufficient documentation

## 2016-05-28 NOTE — ED Triage Notes (Signed)
Pt endorses left ear pain x 1 week, Denies any other sx. Pt has been using otc ear drops without relief. VSS.

## 2016-05-29 ENCOUNTER — Emergency Department (HOSPITAL_COMMUNITY)
Admission: EM | Admit: 2016-05-29 | Discharge: 2016-05-29 | Disposition: A | Discharge status: Home / Self Care | Payer: Medicaid Other | Attending: Emergency Medicine | Admitting: Emergency Medicine

## 2016-05-29 DIAGNOSIS — H9202 Otalgia, left ear: Secondary | ICD-10-CM

## 2016-05-29 MED ORDER — CETIRIZINE-PSEUDOEPHEDRINE ER 5-120 MG PO TB12: 1.0000 | ORAL_TABLET | Freq: Two times a day (BID) | ORAL | 0 refills | Status: DC

## 2016-05-29 MED ORDER — IBUPROFEN 600 MG PO TABS: 600.0000 mg | ORAL_TABLET | Freq: Four times a day (QID) | ORAL | 0 refills | Status: DC | PRN

## 2016-05-29 NOTE — ED Notes (Addendum)
PA-C to see and assess patient before RN assessment and patient currently up for discharge. See PA note.

## 2016-05-29 NOTE — ED Provider Notes (Signed)
[facility] Provider Note   CSN: [id number] Arrival date & time: 05/28/16  2211     History   Chief Complaint Chief Complaint  Patient presents with  . Otalgia    HPI Sharon Adkins is a 26 y.o. female with history of migraines, seasonal allergies, eczema who presents with a one-week history of left ear pain. Patient states it improved a few days ago, but came back today. She states she has also had an associated burning in her gums. Patient notes that she has been using a vape to help her quit smoking cigarettes. She notes that she has noticed symptoms while using the vague. She has used over-the-counter eardrops to remove wax without significant relief. She denies any fevers. She has had some associated nasal congestion, however she does have seasonal allergies. She denies any cough, sore throat. Patient also states that she has noticed herself clenching her teeth lately under stress or when nervous.  HPI  Past Medical History:  Diagnosis Date  . Eczema   . Migraine     There are no active problems to display for this patient.   History reviewed. No pertinent surgical history.  OB History    No data available       Home Medications    Prior to Admission medications   Medication Sig Start Date End Date Taking? Authorizing Provider  cetirizine-pseudoephedrine (ZYRTEC-D) 5-120 MG tablet Take 1 tablet by mouth 2 (two) times daily. 05/29/16   Emi Holes, PA-C  ibuprofen (ADVIL,MOTRIN) 600 MG tablet Take 1 tablet (600 mg total) by mouth every 6 (six) hours as needed. 05/29/16   Emi Holes, PA-C  omeprazole (PRILOSEC) 20 MG capsule Take 1 capsule (20 mg total) by mouth daily. 10/03/15   Rolland Porter, MD    Family History History reviewed. No pertinent family history.  Social History Social History  Substance Use Topics  . Smoking status: Former Smoker    Types: E-cigarettes  . Smokeless tobacco: Not on file  . Alcohol use Yes     Comment: occ      Allergies   Patient has no known allergies.   Review of Systems Review of Systems  Constitutional: Negative for fever.  HENT: Positive for dental problem (gingiva burning sensation) and ear pain. Negative for ear discharge.   Respiratory: Negative for cough.   Skin: Negative for rash and wound.  Psychiatric/Behavioral: The patient is not nervous/anxious.      Physical Exam Updated Vital Signs BP 110/58 (BP Location: Right Arm)   Pulse 90   Temp 99 F (37.2 C) (Oral)   Resp 16   Ht 5\' 5"  (1.651 m)   Wt 52.2 kg   LMP 04/19/2016 (Approximate)   SpO2 100%   BMI 19.14 kg/m   Physical Exam  Constitutional: She appears well-developed and well-nourished. No distress.  HENT:  Head: Normocephalic and atraumatic.    Right Ear: Tympanic membrane normal. No mastoid tenderness.  Left Ear: Tympanic membrane normal. No mastoid tenderness.  Mouth/Throat: Oropharynx is clear and moist. No trismus in the jaw. Normal dentition. No oropharyngeal exudate, posterior oropharyngeal edema or posterior oropharyngeal erythema.  No tenderness to palpation of any teeth or gingiva  Eyes: Conjunctivae are normal. Pupils are equal, round, and reactive to light. Right eye exhibits no discharge. Left eye exhibits no discharge. No scleral icterus.  Neck: Normal range of motion. Neck supple. No thyromegaly present.  Cardiovascular: Normal rate, regular rhythm, normal heart sounds and intact distal pulses.  Exam reveals no gallop and no friction rub.   No murmur heard. Pulmonary/Chest: Effort normal and breath sounds normal. No stridor. No respiratory distress. She has no wheezes. She has no rales.  Abdominal: Soft. Bowel sounds are normal. She exhibits no distension. There is no tenderness. There is no rebound and no guarding.  Musculoskeletal: She exhibits no edema.  Lymphadenopathy:    She has no cervical adenopathy.  Neurological: She is alert. Coordination normal.  Skin: Skin is warm and dry.  No rash noted. She is not diaphoretic. No pallor.  Psychiatric: She has a normal mood and affect.  Nursing note and vitals reviewed.    ED Treatments / Results  Labs (all labs ordered are listed, but only abnormal results are displayed) Labs Reviewed - No data to display  EKG  EKG Interpretation None       Radiology No results found.  Procedures Procedures (including critical care time)  Medications Ordered in ED Medications - No data to display   Initial Impression / Assessment and Plan / ED Course  I have reviewed the triage vital signs and the nursing notes.  Pertinent labs & imaging results that were available during my care of the patient were reviewed by me and considered in my medical decision making (see chart for details).     Patient symptoms may be related to her vape and have a etiology related to the TMJ. TMs clear. No mastoid tenderness. Advised patient to use heat. Will discharge home with ibuprofen. Follow-up to dentist. Return precautions discussed. Patient understands and agrees with plan. Patient vitals stable throughout ED course and discharged in satisfactory condition. Patient also evaluated by Dr. Preston FleetingGlick who guided the patient's management and agrees with plan.  Final Clinical Impressions(s) / ED Diagnoses   Final diagnoses:  Left ear pain    New Prescriptions New Prescriptions   CETIRIZINE-PSEUDOEPHEDRINE (ZYRTEC-D) 5-120 MG TABLET    Take 1 tablet by mouth 2 (two) times daily.   IBUPROFEN (ADVIL,MOTRIN) 600 MG TABLET    Take 1 tablet (600 mg total) by mouth every 6 (six) hours as needed.     Emi Holeslexandra M Paydon Carll, PA-C 05/29/16 [id number]    Dione Boozeavid Glick, MD 05/29/16 [phone]

## 2016-05-29 NOTE — Discharge Instructions (Signed)
Medications: ibuprofen, Zyrtec-D  Treatment: Take ibuprofen every 6 hours as needed for your pain. Take Zyrtec-D twice daily for your allergies and nasal congestion. Do not take along with your regular Zyrtec. Take one or the other. Use heat to your jaw 3-4 times daily alternating 20 minutes on, 20 minutes off.  Follow-up: Please follow-up and establish care with a dentist and primary care provider by calling the numbers attached your paperwork. Please return to the emergency department if you develop any new or worsening symptoms.

## 2016-05-29 NOTE — ED Notes (Signed)
Patient verbalized understanding of discharge instructions and denies any further needs or questions at this time. VS stable. Patient ambulatory with steady gait.  

## 2016-08-13 ENCOUNTER — Ambulatory Visit (HOSPITAL_COMMUNITY)
Admission: EM | Admit: 2016-08-13 | Discharge: 2016-08-13 | Disposition: A | Discharge status: Home / Self Care | Payer: Medicaid Other | Attending: Nurse Practitioner | Admitting: Nurse Practitioner

## 2016-08-13 ENCOUNTER — Encounter (HOSPITAL_COMMUNITY): Payer: Self-pay | Admitting: Emergency Medicine

## 2016-08-13 DIAGNOSIS — R300 Dysuria: Secondary | ICD-10-CM

## 2016-08-13 DIAGNOSIS — R350 Frequency of micturition: Secondary | ICD-10-CM

## 2016-08-13 LAB — POCT URINALYSIS DIP (DEVICE)
Glucose, UA: 100 mg/dL — AB
Hgb urine dipstick: NEGATIVE
Ketones, ur: NEGATIVE mg/dL
LEUKOCYTES UA: NEGATIVE
NITRITE: NEGATIVE
PH: 6 (ref 5.0–8.0)
Protein, ur: 30 mg/dL — AB
Specific Gravity, Urine: >=1.03 (ref 1.005–1.030)
UROBILINOGEN UA: 0.2 mg/dL (ref 0.0–1.0)

## 2016-08-13 MED ORDER — PHENAZOPYRIDINE HCL 200 MG PO TABS: 200.0000 mg | ORAL_TABLET | Freq: Three times a day (TID) | ORAL | 0 refills | Status: DC

## 2016-08-13 NOTE — ED Triage Notes (Signed)
Here for UTI sx onset 1 week associated w/dysuria and foul urine odor, urinary freq/urgency  Denies f/v/n/d, hematuria  A&O x4... NAD

## 2016-08-13 NOTE — ED Provider Notes (Signed)
CSN: [id number]     Arrival date & time 08/13/16  1434 History   None    No chief complaint on file.  (Consider location/radiation/quality/duration/timing/severity/associated sxs/prior Treatment) Subjective:  Sharon Adkins is a 26 y.o. female who complains of burning with urination, foul smelling urine, frequency, suprapubic pressure and urgency for 1 week. Patient denies back pain, fever, flank pain, nausea, vomiting or vaginal discharge.  Patient does not have a history of recurrent UTI.  Patient does not have a history of pyelonephritis.  The following portions of the patient's history were reviewed and updated as appropriate: allergies, current medications, past family history, past medical history, past social history, past surgical history and problem list.       .       Past Medical History:  Diagnosis Date  . Eczema   . Migraine    No past surgical history on file. No family history on file. Social History  Substance Use Topics  . Smoking status: Former Smoker    Types: E-cigarettes  . Smokeless tobacco: Not on file  . Alcohol use Yes     Comment: occ   OB History    No data available     Review of Systems  Constitutional: Negative for fever.  Gastrointestinal: Negative for abdominal pain, nausea and vomiting.  Genitourinary: Positive for dysuria, frequency and urgency. Negative for flank pain, hematuria, vaginal bleeding, vaginal discharge and vaginal pain.    Allergies  Patient has no known allergies.  Home Medications   Prior to Admission medications   Medication Sig Start Date End Date Taking? Authorizing Provider  cetirizine-pseudoephedrine (ZYRTEC-D) 5-120 MG tablet Take 1 tablet by mouth 2 (two) times daily. 05/29/16   Emi Holes, PA-C  ibuprofen (ADVIL,MOTRIN) 600 MG tablet Take 1 tablet (600 mg total) by mouth every 6 (six) hours as needed. 05/29/16   Emi Holes, PA-C  omeprazole (PRILOSEC) 20 MG capsule Take 1 capsule (20 mg  total) by mouth daily. 10/03/15   Rolland Porter, MD   Meds Ordered and Administered this Visit  Medications - No data to display  There were no vitals taken for this visit. No data found.   Physical Exam  Constitutional: She is oriented to person, place, and time. She appears well-developed and well-nourished. No distress.  Neck: Normal range of motion. Neck supple.  Cardiovascular: Normal rate, regular rhythm and normal heart sounds.   Pulmonary/Chest: Effort normal and breath sounds normal.  Abdominal: Soft. Bowel sounds are normal. She exhibits no distension. There is no tenderness.  No CVA tenderness   Musculoskeletal: Normal range of motion.  Neurological: She is alert and oriented to person, place, and time.  Skin: Skin is warm and dry.  Psychiatric: She has a normal mood and affect.    Urgent Care Course     Procedures (including critical care time)  Labs Review Labs Reviewed  POCT URINALYSIS DIP (DEVICE) - Abnormal; Notable for the following:       Result Value   Glucose, UA 100 (*)    Bilirubin Urine SMALL (*)    Protein, ur 30 (*)    All other components within normal limits    Imaging Review No results found.   Visual Acuity Review  Right Eye Distance:   Left Eye Distance:   Bilateral Distance:    Right Eye Near:   Left Eye Near:    Bilateral Near:         MDM   1. Dysuria  1. Medications: pyridium  2. Maintain adequate hydration 3. Follow up if symptoms not improving, and prn   Lurline IdolSamantha Izzy Doubek, FNP 08/13/16 1516

## 2016-12-25 ENCOUNTER — Ambulatory Visit (HOSPITAL_COMMUNITY)
Admission: EM | Admit: 2016-12-25 | Discharge: 2016-12-25 | Disposition: A | Discharge status: Home / Self Care | Payer: Self-pay | Attending: Family Medicine | Admitting: Family Medicine

## 2016-12-25 ENCOUNTER — Encounter (HOSPITAL_COMMUNITY): Payer: Self-pay | Admitting: Emergency Medicine

## 2016-12-25 DIAGNOSIS — Z3202 Encounter for pregnancy test, result negative: Secondary | ICD-10-CM

## 2016-12-25 DIAGNOSIS — N760 Acute vaginitis: Secondary | ICD-10-CM

## 2016-12-25 DIAGNOSIS — H00015 Hordeolum externum left lower eyelid: Secondary | ICD-10-CM

## 2016-12-25 LAB — POCT URINALYSIS DIP (DEVICE)
BILIRUBIN URINE: NEGATIVE
GLUCOSE, UA: NEGATIVE mg/dL
HGB URINE DIPSTICK: NEGATIVE
Ketones, ur: NEGATIVE mg/dL
LEUKOCYTES UA: NEGATIVE
NITRITE: NEGATIVE
Protein, ur: NEGATIVE mg/dL
SPECIFIC GRAVITY, URINE: 1.01 (ref 1.005–1.030)
Urobilinogen, UA: 0.2 mg/dL (ref 0.0–1.0)
pH: 6 (ref 5.0–8.0)

## 2016-12-25 LAB — POCT PREGNANCY, URINE: Preg Test, Ur: NEGATIVE

## 2016-12-25 MED ORDER — FLUCONAZOLE 150 MG PO TABS: 150.0000 mg | ORAL_TABLET | Freq: Once | ORAL | 0 refills | Status: AC

## 2016-12-25 NOTE — Discharge Instructions (Signed)
You almost certainly have a yeast infection.  Confirmatory test is pending and won't be back for 3 days.  Continue using hot compresses to the left eyelid until swelling has resolved.

## 2016-12-25 NOTE — ED Provider Notes (Signed)
[facility]   [id number] 12/25/16 Arrival Time: 1332   SUBJECTIVE:  Sharon Adkins is a 26 y.o. female who presents to the urgent care with complaint of  stye to lower left eye lid, onset this am  Also c/o vag irritation onset 4 days .... Reports using a new tampon ... sts some d/c but nothing abnormal.   Sexually active w/female partner.... A&O x4... NAD... Ambulatory      Past Medical History:  Diagnosis Date  . Eczema   . Migraine    History reviewed. No pertinent family history. Social History   Social History  . Marital status: Single    Spouse name: N/A  . Number of children: N/A  . Years of education: N/A   Occupational History  . Not on file.   Social History Main Topics  . Smoking status: Former Smoker    Types: E-cigarettes  . Smokeless tobacco: Never Used  . Alcohol use Yes     Comment: occ  . Drug use: Yes    Types: Marijuana  . Sexual activity: Not Currently   Other Topics Concern  . Not on file   Social History Narrative  . No narrative on file   No outpatient prescriptions have been marked as taking for the 12/25/16 encounter [facility] Encounter).   No Known Allergies    ROS: As per HPI, remainder of ROS negative.   OBJECTIVE:   Vitals:   12/25/16 1451  Pulse: 66  Resp: 20  Temp: 98.2 F (36.8 C)  TempSrc: Oral  SpO2: 100%     General appearance: alert; no distress Eyes: PERRL; EOMI; conjunctiva normal HENT: normocephalic; atraumatic; TMs normal, canal normal, external ears normal without trauma; nasal mucosa normal; oral mucosa normal Neck: supple Lungs: clear to auscultation bilaterally Heart: regular rate and rhythm Abdomen: soft, non-tender; bowel sounds normal; no masses or organomegaly; no guarding or rebound tenderness Back: no CVA tenderness Extremities: no cyanosis or edema; symmetrical with no gross deformities Skin: warm and dry Neurologic: normal gait; grossly normal Psychological: alert and  cooperative; normal mood and affect      Labs:  Results for orders placed or performed during the hospital encounter of 12/25/16  POCT urinalysis dip (device)  Result Value Ref Range   Glucose, UA NEGATIVE NEGATIVE mg/dL   Bilirubin Urine NEGATIVE NEGATIVE   Ketones, ur NEGATIVE NEGATIVE mg/dL   Specific Gravity, Urine 1.010 1.005 - 1.030   Hgb urine dipstick NEGATIVE NEGATIVE   pH 6.0 5.0 - 8.0   Protein, ur NEGATIVE NEGATIVE mg/dL   Urobilinogen, UA 0.2 0.0 - 1.0 mg/dL   Nitrite NEGATIVE NEGATIVE   Leukocytes, UA NEGATIVE NEGATIVE  Pregnancy, urine POC  Result Value Ref Range   Preg Test, Ur NEGATIVE NEGATIVE    Labs Reviewed  POCT URINALYSIS DIP (DEVICE)  POCT PREGNANCY, URINE    No results found.     ASSESSMENT & PLAN:  1. Hordeolum externum of left lower eyelid   2. Vaginitis and vulvovaginitis     Meds ordered this encounter  Medications  . fluconazole (DIFLUCAN) 150 MG tablet    Sig: Take 1 tablet (150 mg total) by mouth once. Repeat if needed    Dispense:  2 tablet    Refill:  0    Reviewed expectations re: course of current medical issues. Questions answered. Outlined signs and symptoms indicating need for more acute intervention. Patient verbalized understanding. After Visit Summary given.        Elvina Sidle,  MD 12/25/16 1510

## 2016-12-25 NOTE — ED Triage Notes (Signed)
Pt here for stye to lower left eye lid onset this am  Also c/o vag irritation onset 4 days .... Reports using a new tampon ... sts some d/c but nothing abnormal.   Sexually active w/female partner.... A&O x4... NAD... Ambulatory

## 2017-05-26 ENCOUNTER — Other Ambulatory Visit: Payer: Self-pay

## 2017-05-26 ENCOUNTER — Ambulatory Visit (HOSPITAL_COMMUNITY)
Admission: EM | Admit: 2017-05-26 | Discharge: 2017-05-26 | Disposition: A | Discharge status: Home / Self Care | Payer: Self-pay | Attending: Family Medicine | Admitting: Family Medicine

## 2017-05-26 ENCOUNTER — Encounter (HOSPITAL_COMMUNITY): Payer: Self-pay | Admitting: Emergency Medicine

## 2017-05-26 DIAGNOSIS — R0600 Dyspnea, unspecified: Secondary | ICD-10-CM

## 2017-05-26 HISTORY — DX: Unspecified asthma, uncomplicated: J45.909

## 2017-05-26 MED ORDER — OMEPRAZOLE 20 MG PO CPDR: 20.0000 mg | DELAYED_RELEASE_CAPSULE | Freq: Every day | ORAL | 1 refills | Status: AC

## 2017-05-26 MED ORDER — ALBUTEROL SULFATE HFA 108 (90 BASE) MCG/ACT IN AERS: 2.0000 | INHALATION_SPRAY | RESPIRATORY_TRACT | 1 refills | Status: AC | PRN

## 2017-05-26 NOTE — ED Triage Notes (Signed)
Pt reports feeling SOB for the last few days.  She states she had a cold for about 10 days a few weeks ago.  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  She is also a smoker.  She denies any chest pain.

## 2017-05-26 NOTE — ED Provider Notes (Signed)
[facility]   [id number] 05/26/17 Arrival Time: 1742   SUBJECTIVE:  Sharon Adkins is a 27 y.o. female who presents to the urgent care with complaint of SOB for the last few days.  She states she had a cold for about 10 days a few weeks ago.  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  She is also a smoker.  She denies any chest pain.  Patient had similar symptoms last year which was diagnosed as reflux esophagitis.  Patient has had exercise-induced asthma as a teenager but has not had any wheezing.  She denies any fever.  Patient works as a [employer].  She normally smokes but says she quit 2 weeks ago.  Past Medical History:  Diagnosis Date  . Asthma    exercise induced  . Eczema   . Migraine    Family History  Problem Relation Age of Onset  . Hypertension Father    Social History   Socioeconomic History  . Marital status: Single    Spouse name: Not on file  . Number of children: Not on file  . Years of education: Not on file  . Highest education level: Not on file  Social Needs  . Financial resource strain: Not on file  . Food insecurity - worry: Not on file  . Food insecurity - inability: Not on file  . Transportation needs - medical: Not on file  . Transportation needs - non-medical: Not on file  Occupational History  . Not on file  Tobacco Use  . Smoking status: Current Some Day Smoker    Types: E-cigarettes, Cigars  . Smokeless tobacco: Never Used  . Tobacco comment: Black & Milds  Substance and Sexual Activity  . Alcohol use: Yes    Comment: occ  . Drug use: Yes    Types: Marijuana  . Sexual activity: Not Currently  Other Topics Concern  . Not on file  Social History Narrative  . Not on file   No outpatient medications have been marked as taking for the 05/26/17 encounter [facility](Hospital Encounter).   No Known Allergies    ROS: As per HPI, remainder of ROS negative.   OBJECTIVE:   Vitals:   05/26/17 1842  BP:  111/73  Pulse: (!) 56  Temp: 98 F (36.7 C)  TempSrc: Oral  SpO2: 100%     General appearance: alert; no distress Eyes: PERRL; EOMI; conjunctiva normal HENT: normocephalic; atraumatic; TMs normal, canal normal, external ears normal without trauma; nasal mucosa normal; oral mucosa normal Neck: supple Lungs: clear to auscultation bilaterally Heart: regular rate and rhythm Back: no CVA tenderness Extremities: no cyanosis or edema; symmetrical with no gross deformities Skin: warm and dry Neurologic: normal gait; grossly normal Psychological: alert and cooperative; normal mood and affect      Labs:  Results for orders placed or performed during the hospital encounter of 12/25/16  POCT urinalysis dip (device)  Result Value Ref Range   Glucose, UA NEGATIVE NEGATIVE mg/dL   Bilirubin Urine NEGATIVE NEGATIVE   Ketones, ur NEGATIVE NEGATIVE mg/dL   Specific Gravity, Urine 1.010 1.005 - 1.030   Hgb urine dipstick NEGATIVE NEGATIVE   pH 6.0 5.0 - 8.0   Protein, ur NEGATIVE NEGATIVE mg/dL   Urobilinogen, UA 0.2 0.0 - 1.0 mg/dL   Nitrite NEGATIVE NEGATIVE   Leukocytes, UA NEGATIVE NEGATIVE  Pregnancy, urine POC  Result Value Ref Range   Preg Test, Ur NEGATIVE NEGATIVE  Labs Reviewed - No data to display  No results found.     ASSESSMENT & PLAN:  1. Dyspnea, unspecified type     Meds ordered this encounter  Medications  . omeprazole (PRILOSEC) 20 MG capsule    Sig: Take 1 capsule (20 mg total) by mouth daily.    Dispense:  13 capsule    Refill:  1  . albuterol (PROVENTIL HFA;VENTOLIN HFA) 108 (90 Base) MCG/ACT inhaler    Sig: Inhale 2 puffs into the lungs every 4 (four) hours as needed for wheezing or shortness of breath (cough, shortness of breath or wheezing.).    Dispense:  1 Inhaler    Refill:  1    Reviewed expectations re: course of current medical issues. Questions answered. Outlined signs and symptoms indicating need for more acute  intervention. Patient verbalized understanding. After Visit Summary given.    Procedures:      Elvina Sidle, MD 05/26/17 1900

## 2017-05-26 NOTE — Discharge Instructions (Signed)
Please return if your symptoms persist more than 5 more days.

## 2017-06-06 ENCOUNTER — Emergency Department (HOSPITAL_COMMUNITY)
Admission: EM | Admit: 2017-06-06 | Discharge: 2017-06-06 | Disposition: A | Discharge status: Home / Self Care | Payer: Self-pay | Attending: Emergency Medicine | Admitting: Emergency Medicine

## 2017-06-06 ENCOUNTER — Other Ambulatory Visit: Payer: Self-pay

## 2017-06-06 ENCOUNTER — Encounter (HOSPITAL_COMMUNITY): Payer: Self-pay

## 2017-06-06 DIAGNOSIS — Z79899 Other long term (current) drug therapy: Secondary | ICD-10-CM | POA: Insufficient documentation

## 2017-06-06 DIAGNOSIS — F17290 Nicotine dependence, other tobacco product, uncomplicated: Secondary | ICD-10-CM | POA: Insufficient documentation

## 2017-06-06 DIAGNOSIS — R6883 Chills (without fever): Secondary | ICD-10-CM | POA: Insufficient documentation

## 2017-06-06 DIAGNOSIS — R112 Nausea with vomiting, unspecified: Secondary | ICD-10-CM | POA: Insufficient documentation

## 2017-06-06 DIAGNOSIS — J45909 Unspecified asthma, uncomplicated: Secondary | ICD-10-CM | POA: Insufficient documentation

## 2017-06-06 LAB — CBC
HCT: 36.6 % (ref 36.0–46.0)
Hemoglobin: 13 g/dL (ref 12.0–15.0)
MCH: 29.7 pg (ref 26.0–34.0)
MCHC: 35.5 g/dL (ref 30.0–36.0)
MCV: 83.6 fL (ref 78.0–100.0)
PLATELETS: 248 10*3/uL (ref 150–400)
RBC: 4.38 MIL/uL (ref 3.87–5.11)
RDW: 13 % (ref 11.5–15.5)
WBC: 3.6 10*3/uL — AB (ref 4.0–10.5)

## 2017-06-06 LAB — COMPREHENSIVE METABOLIC PANEL
ALK PHOS: 37 U/L — AB (ref 38–126)
ALT: 10 U/L — AB (ref 14–54)
AST: 19 U/L (ref 15–41)
Albumin: 4.8 g/dL (ref 3.5–5.0)
Anion gap: 14 (ref 5–15)
BILIRUBIN TOTAL: 0.7 mg/dL (ref 0.3–1.2)
BUN: 7 mg/dL (ref 6–20)
CO2: 22 mmol/L (ref 22–32)
CREATININE: 0.65 mg/dL (ref 0.44–1.00)
Calcium: 9.9 mg/dL (ref 8.9–10.3)
Chloride: 103 mmol/L (ref 101–111)
GFR calc Af Amer: >60 mL/min (ref >60)
Glucose, Bld: 86 mg/dL (ref 65–99)
Potassium: 3.7 mmol/L (ref 3.5–5.1)
Sodium: 139 mmol/L (ref 135–145)
TOTAL PROTEIN: 8.1 g/dL (ref 6.5–8.1)

## 2017-06-06 LAB — LIPASE, BLOOD: Lipase: 27 U/L (ref 11–51)

## 2017-06-06 LAB — I-STAT BETA HCG BLOOD, ED (MC, WL, AP ONLY): I-stat hCG, quantitative: <5 m[IU]/mL (ref <5)

## 2017-06-06 MED ORDER — ONDANSETRON 4 MG PO TBDP
4.0000 mg | ORAL_TABLET | Freq: Once | ORAL | Status: AC | PRN
  Administered 2017-06-06: 4 mg via ORAL
  Filled 2017-06-06: qty 1

## 2017-06-06 MED ORDER — ONDANSETRON 4 MG PO TBDP: 4.0000 mg | ORAL_TABLET | Freq: Three times a day (TID) | ORAL | 0 refills | Status: AC | PRN

## 2017-06-06 MED ORDER — SODIUM CHLORIDE 0.9 % IV BOLUS (SEPSIS)
1000.0000 mL | Freq: Once | INTRAVENOUS | Status: AC
  Administered 2017-06-06: 1000 mL via INTRAVENOUS

## 2017-06-06 NOTE — ED Provider Notes (Signed)
[facility] EMERGENCY DEPARTMENT Provider Note   CSN: [id number] Arrival date & time: 06/06/17  1118     History   Chief Complaint Chief Complaint  Patient presents with  . Nausea    HPI Sharon Adkins is a 27 y.o. female with history of tobacco abuse, asthma, and migraines who presents to the emergency department with nausea and vomiting that has been intermittent since last night. Patient states that she was consuming alcohol last night, estimated 7 shots of whiskey, states she is an occasional drinker, does not consume on a daily basis.  States she started to feel nauseous last night around 11:00, had approximately 3 episodes of nonbloody emesis and was able to go to sleep.  This morning woke up and had an additional 4-5 episodes of vomiting this morning.  Having some abdominal cramping only when vomiting, otherwise no abdominal pain. States she is experiencing some chills and generalized achy feeling. No other alleviating or aggravating factors.  Denies fever, diarrhea, blood in stool, constipation, dysuria, vaginal discharge, or vaginal bleeding.  Patient's LMP was at the end of January patient is unsure of exact date.   HPI  Past Medical History:  Diagnosis Date  . Asthma    exercise induced  . Eczema   . Migraine     There are no active problems to display for this patient.   History reviewed. No pertinent surgical history.  OB History    No data available       Home Medications    Prior to Admission medications   Medication Sig Start Date End Date Taking? Authorizing Provider  albuterol (PROVENTIL HFA;VENTOLIN HFA) 108 (90 Base) MCG/ACT inhaler Inhale 2 puffs into the lungs every 4 (four) hours as needed for wheezing or shortness of breath (cough, shortness of breath or wheezing.). 05/26/17   Elvina Sidle, MD  omeprazole (PRILOSEC) 20 MG capsule Take 1 capsule (20 mg total) by mouth daily. 05/26/17   Elvina Sidle, MD    Family  History Family History  Problem Relation Age of Onset  . Hypertension Father     Social History Social History   Tobacco Use  . Smoking status: Current Some Day Smoker    Types: E-cigarettes, Cigars  . Smokeless tobacco: Never Used  . Tobacco comment: Black & Milds  Substance Use Topics  . Alcohol use: Yes    Comment: occ  . Drug use: Yes    Types: Marijuana     Allergies   Patient has no known allergies.   Review of Systems Review of Systems  Constitutional: Positive for chills. Negative for fever.  Respiratory: Negative for cough and shortness of breath.   Cardiovascular: Negative for chest pain.  Gastrointestinal: Positive for abdominal pain (only when vomiting), nausea and vomiting. Negative for blood in stool, constipation and diarrhea.  Genitourinary: Negative for dysuria, vaginal bleeding and vaginal discharge.  All other systems reviewed and are negative.   Physical Exam Updated Vital Signs BP (!) 133/95 (BP Location: Right Arm)   Pulse 78   Temp 98.4 F (36.9 C) (Oral)   Resp 16   LMP 05/22/2017 (Approximate)   SpO2 100%   Physical Exam  Constitutional: She appears well-developed and well-nourished. No distress.  HENT:  Head: Normocephalic and atraumatic.  Mouth/Throat: Oropharynx is clear and moist.  Eyes: Conjunctivae are normal. Right eye exhibits no discharge. Left eye exhibits no discharge.  Cardiovascular: Normal rate and regular rhythm.  No murmur heard. Pulmonary/Chest: Breath sounds normal.  No respiratory distress. She has no wheezes. She has no rales.  Abdominal: Soft. Normal appearance. She exhibits no distension. There is no tenderness. There is no rigidity, no rebound, no guarding, no CVA tenderness and no tenderness at McBurney's point.  Neurological: She is alert.  Clear speech.   Skin: Skin is warm and dry. No rash noted.  Psychiatric: She has a normal mood and affect. Her behavior is normal.  Nursing note and vitals reviewed.  ED  Treatments / Results  Labs Results for orders placed or performed during the hospital encounter of 06/06/17  Lipase, blood  Result Value Ref Range   Lipase 27 11 - 51 U/L  Comprehensive metabolic panel  Result Value Ref Range   Sodium 139 135 - 145 mmol/L   Potassium 3.7 3.5 - 5.1 mmol/L   Chloride 103 101 - 111 mmol/L   CO2 22 22 - 32 mmol/L   Glucose, Bld 86 65 - 99 mg/dL   BUN 7 6 - 20 mg/dL   Creatinine, Ser 1.61 0.44 - 1.00 mg/dL   Calcium 9.9 8.9 - 09.6 mg/dL   Total Protein 8.1 6.5 - 8.1 g/dL   Albumin 4.8 3.5 - 5.0 g/dL   AST 19 15 - 41 U/L   ALT 10 (L) 14 - 54 U/L   Alkaline Phosphatase 37 (L) 38 - 126 U/L   Total Bilirubin 0.7 0.3 - 1.2 mg/dL   GFR calc non Af Amer >60 >60 mL/min   GFR calc Af Amer >60 >60 mL/min   Anion gap 14 5 - 15  CBC  Result Value Ref Range   WBC 3.6 (L) 4.0 - 10.5 K/uL   RBC 4.38 3.87 - 5.11 MIL/uL   Hemoglobin 13.0 12.0 - 15.0 g/dL   HCT 04.5 40.9 - 81.1 %   MCV 83.6 78.0 - 100.0 fL   MCH 29.7 26.0 - 34.0 pg   MCHC 35.5 30.0 - 36.0 g/dL   RDW 91.4 78.2 - 95.6 %   Platelets 248 150 - 400 K/uL  I-Stat beta hCG blood, ED  Result Value Ref Range   I-stat hCG, quantitative <5.0 <5 mIU/mL   Comment 3           No results found. EKG  EKG Interpretation None       Radiology No results found.  Procedures Procedures (including critical care time)  Medications Ordered in ED Medications  sodium chloride 0.9 % bolus 1,000 mL (not administered)  ondansetron (ZOFRAN-ODT) disintegrating tablet 4 mg (4 mg Oral Given 06/06/17 1138)   Initial Impression / Assessment and Plan / ED Course  I have reviewed the triage vital signs and the nursing notes.  Pertinent labs & imaging results that were available during my care of the patient were reviewed by me and considered in my medical decision making (see chart for details).   Patient presents to the emergency department with nausea and vomiting that started last night in the setting of  alcohol consumption.  Patient is nontoxic-appearing, in no apparent distress, vitals without significant abnormality.  On exam patient's abdomen is completely non-tender. Will evaluate with screening lab work and initiate tx with fluids and zofran.   13:35: RE-EVAL: Patient feeling much better, she is tolerating PO, states she feels ready to go home.   Patient's lab work grossly unremarkable, WBC 3.6- discussed having recheck by PCP with patient . Patient does not meet the SIRS or Sepsis criteria.  On repeat exam she does not have a  surgical abdomen and there are no peritoneal signs.  No indication of appendicitis, bowel obstruction, bowel perforation, cholecystitis, pancreatitis, diverticulitis, or ectopic pregnancy.  Patient discharged home with symptomatic treatment. I discussed results, treatment plan, need for PCP follow-up, and return precautions with the patient. Provided opportunity for questions, patient confirmed understanding and is in agreement with plan.   Vitals:   06/06/17 1135 06/06/17 1348  BP: (!) 133/95 113/80  Pulse: 78 73  Resp: 16 14  Temp: 98.4 F (36.9 C)   SpO2: 100% 100%   Final Clinical Impressions(s) / ED Diagnoses   Final diagnoses:  Non-intractable vomiting with nausea, unspecified vomiting type    ED Discharge Orders        Ordered    ondansetron (ZOFRAN ODT) 4 MG disintegrating tablet  Every 8 hours PRN     06/06/17 1342       Liem Copenhaver, TroutvilleSamantha R, PA-C 06/06/17 1355    Linwood DibblesKnapp, Jon, MD 06/06/17 2210

## 2017-06-06 NOTE — Discharge Instructions (Signed)
You were seen in the emergency department today for nausea and vomiting.  Your lab work was fairly normal, your white blood cell count was slightly low at 3.6, normal is 4-10.5, you may have this rechecked by your primary care doctor.   Your symptoms improved following fluids and Zofran.  You have been discharged home with a prescription for Zofran, this is an antinausea medication, you may take this once every 8 hours as needed for nausea and vomiting.  Follow-up with your primary care provider in 3 days for reevaluation of your symptoms.  If you do not have a primary care provider, you may follow-up with our [facility] or you may call the hotline numbers circled in your discharge instructions to set up primary care.  Return to the emergency department for new or worsening symptoms or any other concerns you may have.

## 2017-06-06 NOTE — ED Triage Notes (Signed)
Pt presents for evaluation of body aches, N/V. Pt reports she drank a lot last night and woke up sick. Pt denies daily use of alcohol. Alert and oriented x 4.
